# Patient Record
Sex: Female | Born: 1975 | Hispanic: Yes | Marital: Married | Smoking: Never smoker
Health system: Southern US, Community
[De-identification: ages and names within clinical notes are randomized; demographics above are authoritative.]

## PROBLEM LIST (undated history)

## (undated) ENCOUNTER — Inpatient Hospital Stay (HOSPITAL_COMMUNITY): Payer: Self-pay

## (undated) DIAGNOSIS — Z789 Other specified health status: Secondary | ICD-10-CM

## (undated) DIAGNOSIS — F419 Anxiety disorder, unspecified: Secondary | ICD-10-CM

## (undated) DIAGNOSIS — K649 Unspecified hemorrhoids: Secondary | ICD-10-CM

## (undated) DIAGNOSIS — F32A Depression, unspecified: Secondary | ICD-10-CM

## (undated) DIAGNOSIS — H8110 Benign paroxysmal vertigo, unspecified ear: Secondary | ICD-10-CM

## (undated) DIAGNOSIS — F329 Major depressive disorder, single episode, unspecified: Secondary | ICD-10-CM

## (undated) HISTORY — PX: CERVICAL CERCLAGE: SHX1329

## (undated) HISTORY — PX: HEMORRHOID SURGERY: SHX153

## (undated) HISTORY — PX: TUBAL LIGATION: SHX77

---

## 2001-07-04 ENCOUNTER — Other Ambulatory Visit: Admission: RE | Admit: 2001-07-04 | Discharge: 2001-07-04 | Payer: Self-pay | Admitting: Gynecology

## 2005-10-19 ENCOUNTER — Inpatient Hospital Stay (HOSPITAL_COMMUNITY): Admission: AD | Admit: 2005-10-19 | Discharge: 2005-10-19 | Payer: Self-pay | Admitting: Obstetrics and Gynecology

## 2005-10-23 ENCOUNTER — Inpatient Hospital Stay (HOSPITAL_COMMUNITY): Admission: AD | Admit: 2005-10-23 | Discharge: 2005-10-23 | Payer: Self-pay | Admitting: Obstetrics & Gynecology

## 2005-10-24 ENCOUNTER — Inpatient Hospital Stay (HOSPITAL_COMMUNITY): Admission: AD | Admit: 2005-10-24 | Discharge: 2005-10-26 | Payer: Self-pay | Admitting: Obstetrics and Gynecology

## 2005-11-23 ENCOUNTER — Encounter (INDEPENDENT_AMBULATORY_CARE_PROVIDER_SITE_OTHER): Payer: Self-pay | Admitting: Specialist

## 2005-11-23 ENCOUNTER — Ambulatory Visit (HOSPITAL_COMMUNITY): Admission: RE | Admit: 2005-11-23 | Discharge: 2005-11-23 | Payer: Self-pay | Admitting: General Surgery

## 2005-12-23 ENCOUNTER — Ambulatory Visit (HOSPITAL_COMMUNITY): Admission: RE | Admit: 2005-12-23 | Discharge: 2005-12-23 | Payer: Self-pay | Admitting: Obstetrics & Gynecology

## 2006-09-14 ENCOUNTER — Inpatient Hospital Stay (HOSPITAL_COMMUNITY): Admission: AD | Admit: 2006-09-14 | Discharge: 2006-09-14 | Payer: Self-pay | Admitting: Gynecology

## 2006-09-17 ENCOUNTER — Inpatient Hospital Stay (HOSPITAL_COMMUNITY): Admission: AD | Admit: 2006-09-17 | Discharge: 2006-09-17 | Payer: Self-pay | Admitting: Obstetrics & Gynecology

## 2006-09-24 ENCOUNTER — Inpatient Hospital Stay (HOSPITAL_COMMUNITY): Admission: AD | Admit: 2006-09-24 | Discharge: 2006-09-24 | Payer: Self-pay | Admitting: Family Medicine

## 2006-10-08 ENCOUNTER — Inpatient Hospital Stay (HOSPITAL_COMMUNITY): Admission: AD | Admit: 2006-10-08 | Discharge: 2006-10-08 | Payer: Self-pay | Admitting: Obstetrics and Gynecology

## 2006-10-18 ENCOUNTER — Inpatient Hospital Stay (HOSPITAL_COMMUNITY): Admission: AD | Admit: 2006-10-18 | Discharge: 2006-10-18 | Payer: Self-pay | Admitting: Obstetrics and Gynecology

## 2006-10-25 ENCOUNTER — Inpatient Hospital Stay (HOSPITAL_COMMUNITY): Admission: AD | Admit: 2006-10-25 | Discharge: 2006-10-25 | Payer: Self-pay | Admitting: Family Medicine

## 2006-10-29 ENCOUNTER — Inpatient Hospital Stay (HOSPITAL_COMMUNITY): Admission: AD | Admit: 2006-10-29 | Discharge: 2006-10-29 | Payer: Self-pay | Admitting: Gynecology

## 2007-04-27 IMAGING — US US FETAL BPP W/O NONSTRESS
1 series · 14 of 24 positions shown · non-contrast
Comparison: none

CLINICAL DATA: 39 week 5 day assigned gestational age.  Nonreactive NST.  Decreased fetal movement.

[Series 1: us fetal bpp w/o nonstress · 0.33mm/px · 24 acquisitions, 14 frames shown]
[im 1/24]
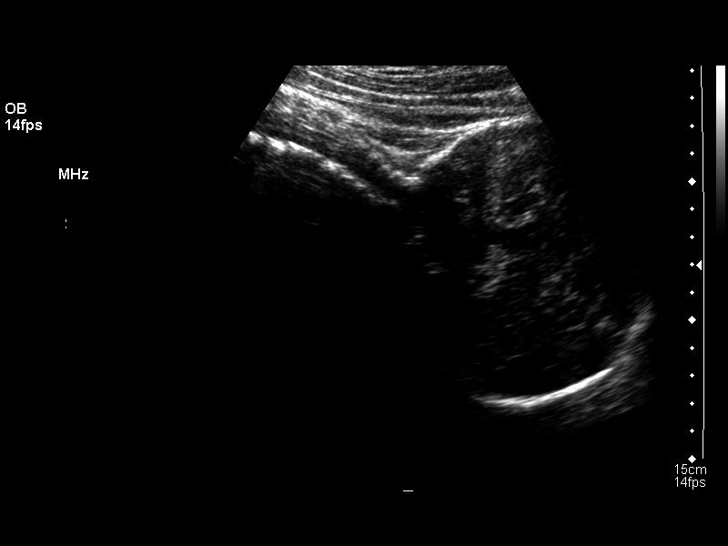
[im 3/24]
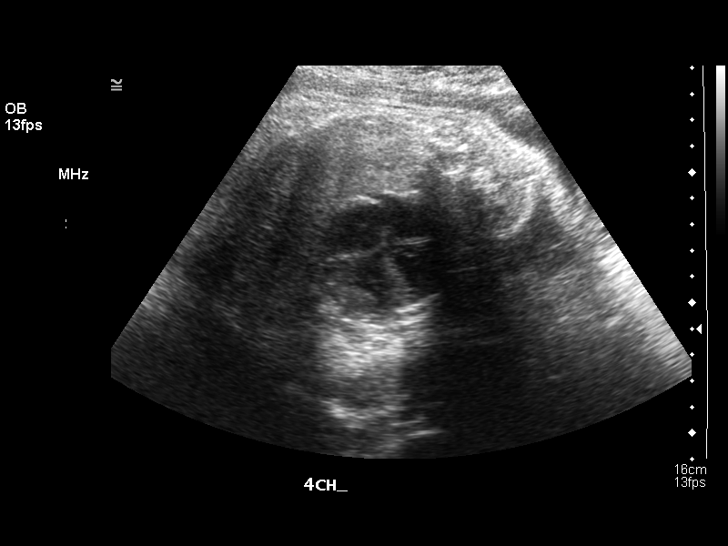
[im 5/24]
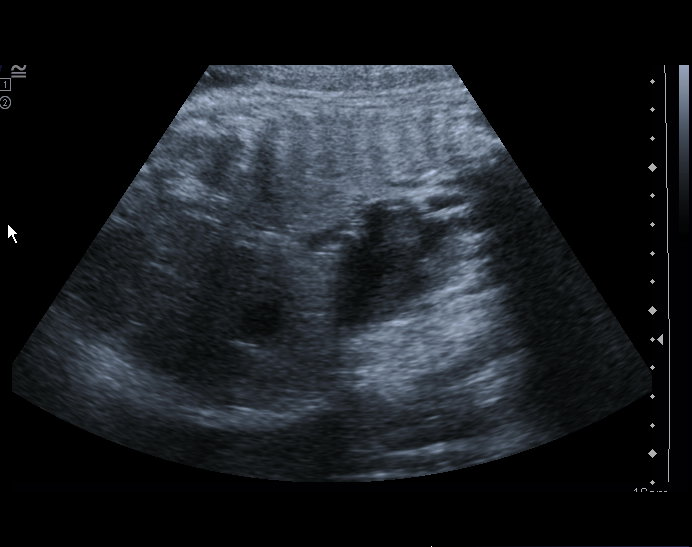
[im 7/24]
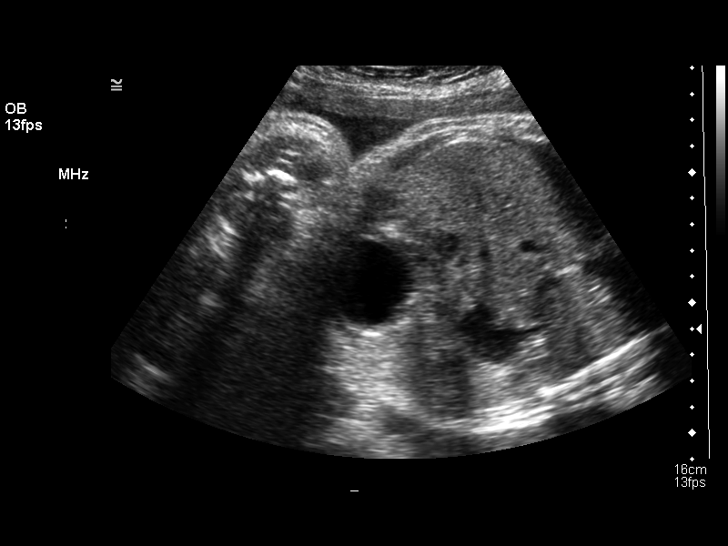
[im 8/24]
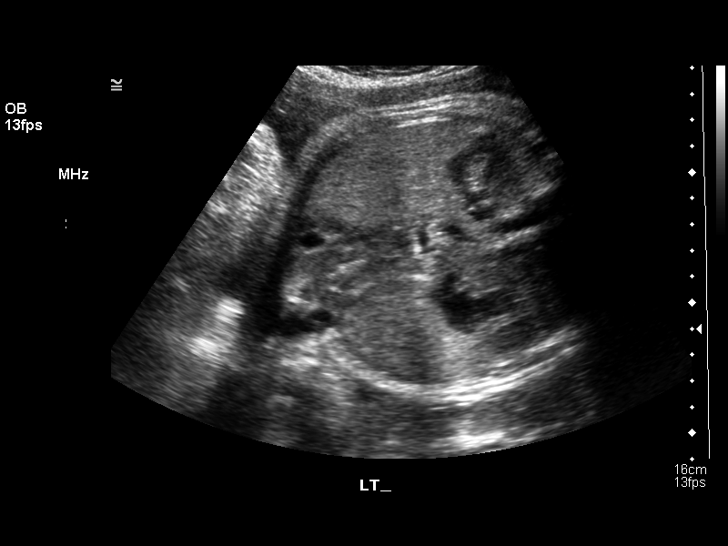
[im 10/24]
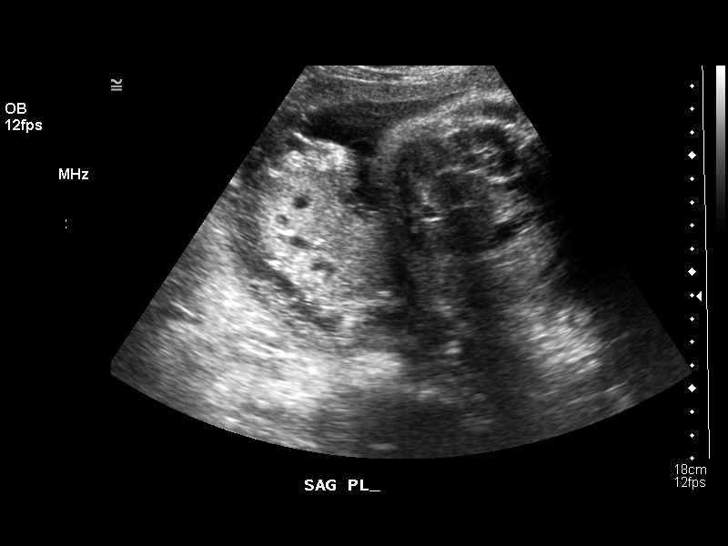
[im 12/24]
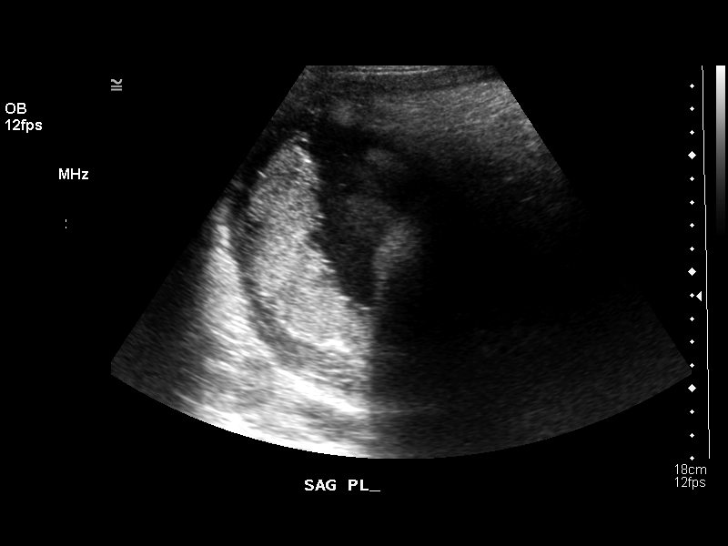
[im 13/24]
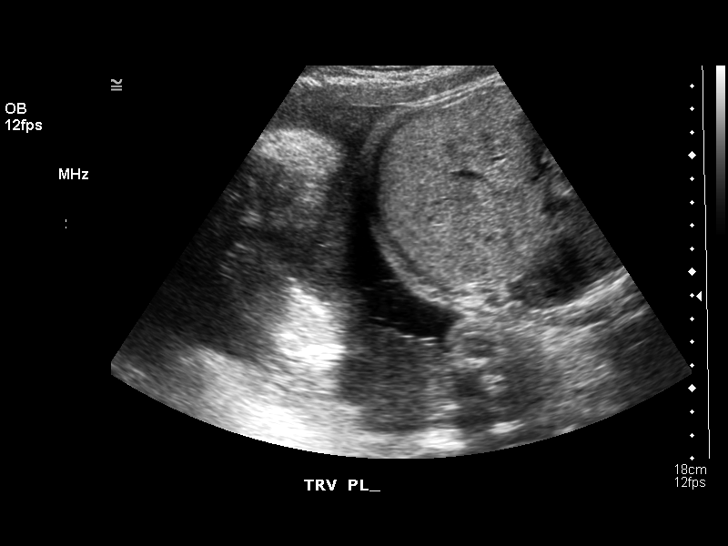
[im 15/24]
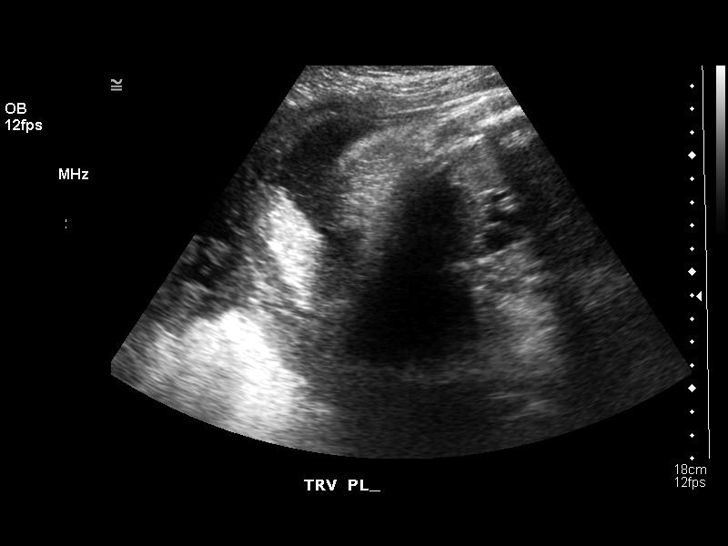
[im 17/24]
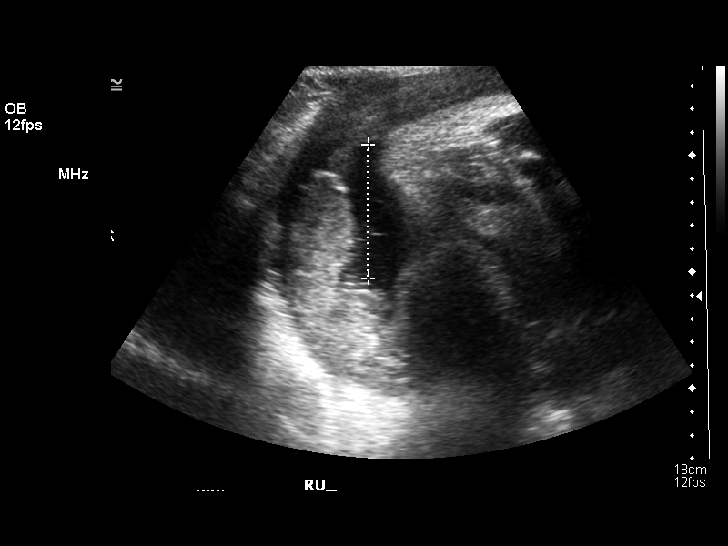
[im 19/24]
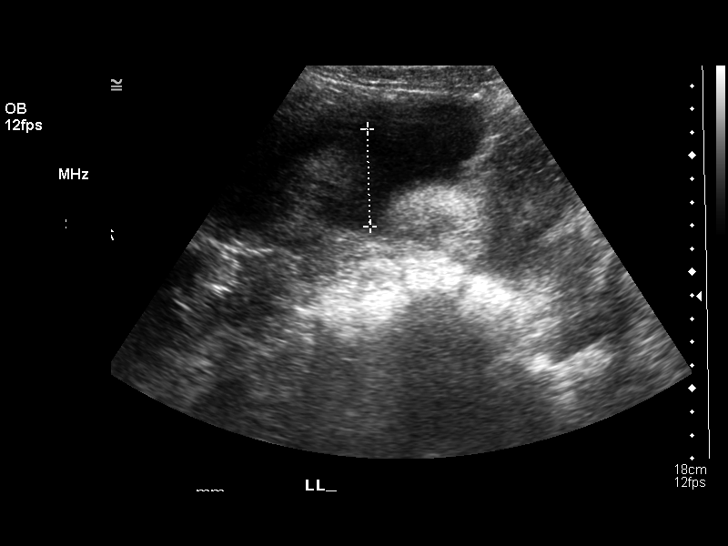
[im 20/24]
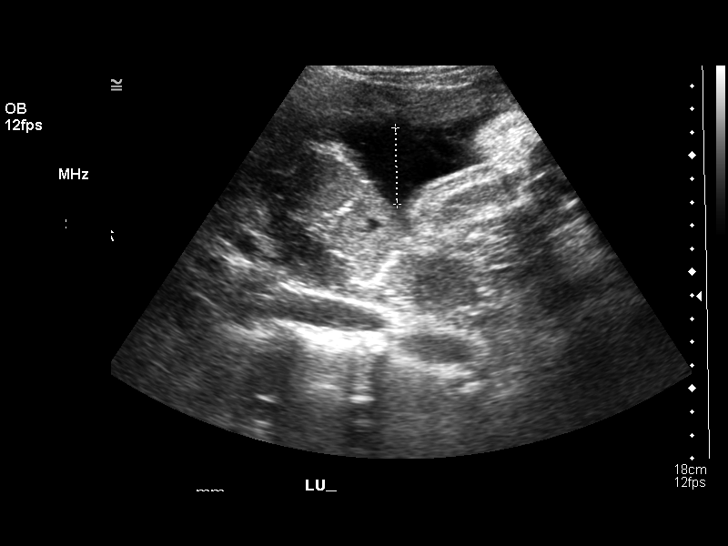
[im 22/24]
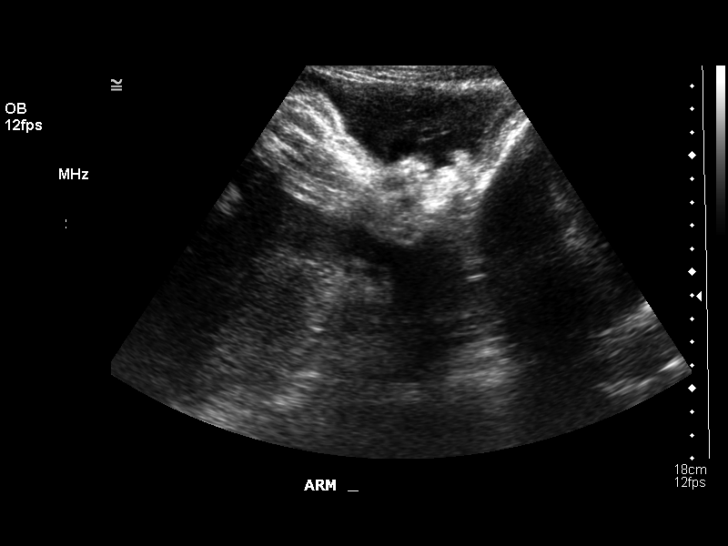
[im 24/24]
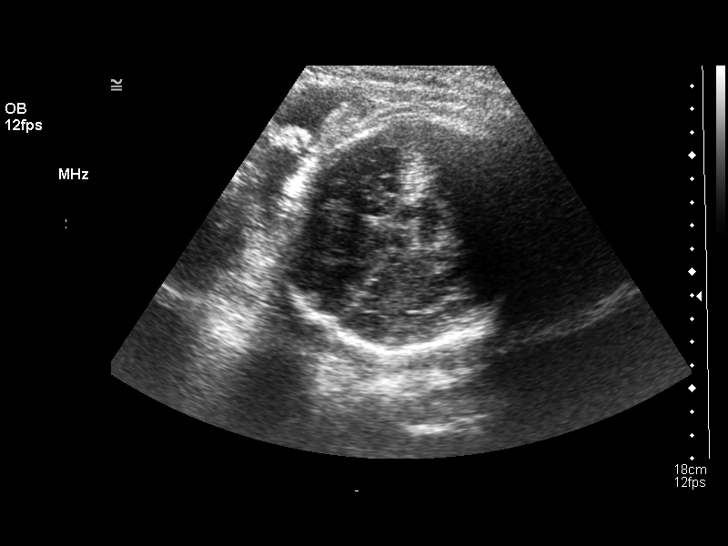

[14 of 24 positions shown; findings below may reference images not displayed]

BIOPHYSICAL PROFILE

 Number of Fetuses: 1
 Heart rate:  144
 Movement:  Yes
 Breathing:  Yes
 Presentation:  Cephalic
 Placental Location:  Posterior
 Grade:  III
 Previa:  No
 Amniotic Fluid (Subjective):  Borderline increased 
 Amniotic Fluid (Objective):  22.4 cm AFI (5th -95th%ile = 7.1 ? 21.4 cm for 40 wks)

 Fetal measurements and complete anatomic evaluation were not requested.  The following fetal anatomy was visualized on this exam:  Thalami, stomach, 3-vessel cord, kidneys, and bladder. 

 BPP SCORING
 Movements:  2  Time:  15 minutes
 Breathing:  2
 Tone: 2
 Amniotic Fluid:  2
 Total Score:  8

 MATERNAL UTERINE AND ADNEXAL FINDINGS
 Cervix: Not evaluated > 34 wks
IMPRESSION: 1.  Single living intrauterine fetus in cephalic presentation.  Borderline increased amniotic fluid volume with AFI of 22.4 cm.
 2.  Biophysical profile score [DATE].

## 2009-06-13 ENCOUNTER — Inpatient Hospital Stay (HOSPITAL_COMMUNITY): Admission: AD | Admit: 2009-06-13 | Discharge: 2009-06-13 | Payer: Self-pay | Admitting: Obstetrics and Gynecology

## 2009-09-23 ENCOUNTER — Inpatient Hospital Stay (HOSPITAL_COMMUNITY): Admission: AD | Admit: 2009-09-23 | Discharge: 2009-09-26 | Payer: Self-pay | Admitting: Obstetrics and Gynecology

## 2009-09-25 ENCOUNTER — Encounter (INDEPENDENT_AMBULATORY_CARE_PROVIDER_SITE_OTHER): Payer: Self-pay | Admitting: Obstetrics and Gynecology

## 2009-10-09 DEATH — deceased

## 2009-11-08 DEATH — deceased

## 2010-04-30 ENCOUNTER — Ambulatory Visit (HOSPITAL_COMMUNITY)
Admission: RE | Admit: 2010-04-30 | Discharge: 2010-04-30 | Payer: Self-pay | Source: Home / Self Care | Attending: Obstetrics & Gynecology | Admitting: Obstetrics & Gynecology

## 2010-05-11 NOTE — L&D Delivery Note (Signed)
Delivery Note At  a viable female was delivered via  (vaginal : ;  ).  APGAR:8 9, ; weight .  7lb 15 oz Placenta status: , to path.  Cord:  with the following complications: .short  Anesthesia:  none Episiotomy: none Lacerations: 2nd degree perineal laceration Suture Repair: 3.0 chromic Est. Blood Loss (mL): 300cc  Mom to postpartum.  Baby to nursery-stable.  Sharol Croghan A 11/20/2010, 2:04 AM

## 2010-06-06 NOTE — Op Note (Addendum)
  NAME:  [name], BUCCIERI       ACCOUNT NO.:  [id number]  MEDICAL RECORD NO.:  [id number]          PATIENT TYPE:  AMB  LOCATION:  SDC                           [facility]  PHYSICIAN:  Genia Del, M.D.DATE OF BIRTH:  06-15-1975  DATE OF PROCEDURE:  04/30/2010 DATE OF DISCHARGE:                              OPERATIVE REPORT   PREOPERATIVE DIAGNOSES:  11 plus weeks' gestation, history of incompetent cervix with premature rupture of membranes and spontaneous abortion previously at 19 plus weeks.  POSTOPERATIVE DIAGNOSES:  11 plus weeks' gestation, history of incompetent cervix with premature rupture of membranes and spontaneous abortion previously at 19 plus weeks.  PROCEDURE:  Modified McDonald cervical cerclage.  SURGEON:  Genia Del, MD  ANESTHESIOLOGIST:  Belva Agee, MD  ASSISTANT:  None.  DESCRIPTION OF PROCEDURE:  Under spinal anesthesia, the patient was in lithotomy position.  She was prepped with Betadine on the suprapubic vulvar and vaginal areas.  She was draped as usual.  The vaginal exam reveals a long and closed cervix.  No vaginal bleeding.  No fluid leak. The weighted speculum was inserted in the vagina.  The anterior lip was grasped, the cervix was grasped with a fenestrated opened clamp.  We used a Mersilene band for the cerclage.  We start at 12 o'clock and moved anticlockwise.  We stayed just under the cervical mucosa at the junction of the vaginal mucosa.  We then go in and out about 4 times. Each time we go back in very close to the point of exit, so that the Mersilene band was well covered with the cervical mucosa.  We then exit at about 0.5 cm of the first entrance.  We passed a Prolene suture under the knots to ease removal when its time.  We attached the Mersilene band with 4 flat knot.  We attached the Prolene stitch with 5 flat knots.  We verified the tension on the cerclage and it was very good, hemostasis was adequate.  We  therefore removed all instruments.  The estimated blood loss was minimal.  The patient received Ancef 1 g IV before the start of the procedure.  The count of instruments and sponges was complete.  No complications occurred and the patient was brought to recovery room in good stable status.     Genia Del, M.D.     ML/MEDQ  D:  04/30/2010  T:  04/30/2010  Job:  [id number]  Electronically Signed by Genia Del M.D. on 06/06/2010 09:15:56 AM

## 2010-07-21 LAB — CBC
MCHC: 33.6 g/dL (ref 30.0–36.0)
MCV: 88.4 fL (ref 78.0–100.0)
Platelets: 241 10*3/uL (ref 150–400)
RDW: 12.9 % (ref 11.5–15.5)

## 2010-07-28 LAB — CBC
Hemoglobin: 11.3 g/dL — ABNORMAL LOW (ref 12.0–15.0)
Hemoglobin: 12.7 g/dL (ref 12.0–15.0)
MCHC: 34.4 g/dL (ref 30.0–36.0)
MCHC: 34.4 g/dL (ref 30.0–36.0)
MCV: 92.3 fL (ref 78.0–100.0)
Platelets: 223 10*3/uL (ref 150–400)
RBC: 3.61 MIL/uL — ABNORMAL LOW (ref 3.87–5.11)
RBC: 4 MIL/uL (ref 3.87–5.11)
RDW: 13.1 % (ref 11.5–15.5)
WBC: 10.4 10*3/uL (ref 4.0–10.5)

## 2010-07-28 LAB — DIFFERENTIAL
Eosinophils Absolute: 0.1 10*3/uL (ref 0.0–0.7)
Neutro Abs: 7.9 10*3/uL — ABNORMAL HIGH (ref 1.7–7.7)
Neutrophils Relative %: 76 % (ref 43–77)

## 2010-07-28 LAB — STREP B DNA PROBE: Strep Group B Ag: NEGATIVE

## 2010-07-28 LAB — MRSA PCR SCREENING: MRSA by PCR: NEGATIVE

## 2010-07-30 LAB — URINALYSIS, ROUTINE W REFLEX MICROSCOPIC
Glucose, UA: NEGATIVE mg/dL
Ketones, ur: NEGATIVE mg/dL
Leukocytes, UA: NEGATIVE
Urobilinogen, UA: 0.2 mg/dL (ref 0.0–1.0)
pH: 6 (ref 5.0–8.0)

## 2010-07-30 LAB — CBC
HCT: 39 % (ref 36.0–46.0)
Hemoglobin: 13.2 g/dL (ref 12.0–15.0)
MCV: 92.1 fL (ref 78.0–100.0)
RDW: 12.9 % (ref 11.5–15.5)
WBC: 4.9 10*3/uL (ref 4.0–10.5)

## 2010-07-30 LAB — ABO/RH: ABO/RH(D): O POS

## 2010-07-30 LAB — WET PREP, GENITAL
Trich, Wet Prep: NONE SEEN
Yeast Wet Prep HPF POC: NONE SEEN

## 2010-07-30 LAB — URINE MICROSCOPIC-ADD ON

## 2010-07-30 LAB — GC/CHLAMYDIA PROBE AMP, GENITAL
Chlamydia, DNA Probe: NEGATIVE
GC Probe Amp, Genital: NEGATIVE

## 2010-07-30 LAB — POCT PREGNANCY, URINE: Preg Test, Ur: POSITIVE

## 2010-09-26 NOTE — Consult Note (Signed)
[name], [name] NO.:  [id number]   MEDICAL RECORD NO.:  [id number]          PATIENT TYPE:  MAT   LOCATION:  MATC                          [facility]ILITY:  [facility]   PHYSICIAN:  Lenoard Aden, M.D.DATE OF BIRTH:  [birth year]   DATE OF CONSULTATION:  DATE OF DISCHARGE:                                   CONSULTATION   This is an emergency room consultation visit.   CHIEF COMPLAINT:  Rule out preeclampsia.   She is a 35 year old Hispanic female, G2, P1, at 39+ weeks with nonreactive  NST and elevated blood pressure in the office, for re-evaluation.  She has a  history of a spontaneous vaginal delivery x1.  She is a nonsmoker and  nondrinker.  She denies domestic physical violence.  She has a personal  history of hemorrhoids and spontaneous vaginal delivery x1.   MEDICATIONS:  Prenatal vitamins.  Duet.   FAMILY HISTORY:  Myocardial infarction, breast cancer.   PHYSICAL EXAMINATION:  GENERAL:  She is a well-developed and well-nourished  Hispanic female in no acute distress.  HEENT:  Normal.  LUNGS:  Clear.  HEART:  Regular rhythm.  ABDOMEN:  Soft, gravid, nontender.  Estimated fetal weight 8-1/2 pounds.  PELVIC:  Cervix, per patient, is 4 cm dilated.  EXTREMITIES:  No cords.  DTRs 2+.  No evidence of clonus.  NEUROLOGIC:  Intact.  VITAL SIGNS:  Blood pressure 133-142/83-93.  Most recent blood pressure  133/88.   The pH labs within normal limits.  CBC, LFTs, uric acid all well within  normal limits.  BPP is 8/8 with an AFI of 22.4 in a cephalic-presenting  fetus.   IMPRESSION:  1.  A 39-week obstetrics.  2.  Reactive nonstress test with reassuring fetal heart rate tracing.  3.  Elevated blood pressures with normalization, no evidence of      preeclampsia.   PLAN:  Discharge home.  Labor warnings given.  Preeclamptic precautions  discussed.     Lenoard Aden, M.D.    RJT/MEDQ  D:  10/23/2005  T:  10/23/2005  Job:  [id number]

## 2010-09-26 NOTE — Op Note (Signed)
NAMESHAKTHI, [name] NO.:  [id number]   MEDICAL RECORD NO.:  [id number]          PATIENT TYPE:  AMB   LOCATION:  SDC                           [facility]   PHYSICIAN:  Genia Del, M.D.DATE OF BIRTH:  Mar 17, 1976   DATE OF PROCEDURE:  12/23/2005  DATE OF DISCHARGE:                                 OPERATIVE REPORT   PREOPERATIVE DIAGNOSIS:  Relaxation of introitus postpartum.   POSTOPERATIVE DIAGNOSIS:  Relaxation of introitus postpartum.   PROCEDURE:  Intervention perineoplasty.   SURGEON:  Genia Del, M.D.   ANESTHESIOLOGIST:  Raul Del, M.D.   DESCRIPTION OF PROCEDURE:  Under general anesthesia with endotracheal  intubation, the patient is in the lithotomy position.  She is prepped with  Betadine in the suprapubic, vulva and vaginal areas and draped as usual.  The introitus reveals significant relaxation allowing introduction of more  than four fingers at that level.  We put Allis clamps on either side of the  perineum and used the scalpel to open a triangular area toward the posterior  fourchette and then another triangular area toward the vagina.  The skin is  removed at that level and disposed of in the trash.  We then closed the  transverse perineal muscle with a Vicryl 3-0 given more closure and strength  to the perineum.  We used separate stitches to achieve that.  We then closed  the vaginal mucosa with a Vicryl 3-0 in a running suture.  We reapproximated  the skin with a subcuticular stitch of Vicryl 4-0 externally.  Hemostasis  was adequate at all levels.  We removed all instruments. The estimated blood  loss was minimal.  No complications occurred and the patient was transferred  to the recovery room in good status.  The introitus after the procedure was  allowing two fingers to pass loosely.      Genia Del, M.D.  Electronically Signed     ML/MEDQ  D:  12/23/2005  T:  12/23/2005  Job:  [id number]

## 2010-09-26 NOTE — H&P (Signed)
NAMECAMERIN, JIMENEZ NO.:  [id number]   MEDICAL RECORD NO.:  [id number]          PATIENT TYPE:  INP   LOCATION:  9120                          [facility]   PHYSICIAN:  Lenoard Aden, M.D.DATE OF BIRTH:  03-11-1976   DATE OF ADMISSION:  10/24/2005  DATE OF DISCHARGE:                                HISTORY & PHYSICAL   CHIEF COMPLAINT:  Labor.   HISTORY OF PRESENT ILLNESS:  She is a 35 year old Hispanic female, G2, P1 at  term, in labor.   PAST MEDICAL HISTORY:  Remarkable for vaginal delivery x1.   ALLERGIES:  NO KNOWN DRUG ALLERGIES.   MEDICATIONS:  Prenatal vitamins.   SOCIAL HISTORY:  Noncontributory. Non-smoker, non-drinker. She denies  domestic or physical violence.   FAMILY HISTORY:  Noncontributory.   PHYSICAL EXAMINATION:  GENERAL:  She is an uncomfortable appearing Hispanic  female in moderate amount of distress.  HEENT:  Normal.  LUNGS:  Clear.  HEART:  Regular rhythm.  ABDOMEN:  Soft, gravid, nontender. Estimated fetal weight 8 pounds.  GENITOURINARY:  Cervix is 10 cm, 100%, vertex zero.  EXTREMITIES:  No cords.  NEUROLOGIC:  Examination is non-focal.   IMPRESSION:  Term intrauterine pregnancy in active labor.   PLAN:  Anticipate attempt at vaginal delivery.      Lenoard Aden, M.D.     RJT/MEDQ  D:  10/25/2005  T:  10/25/2005  Job:  [id number]

## 2010-09-26 NOTE — Op Note (Signed)
[name], [name] NO.:  [id number]   MEDICAL RECORD NO.:  [id number]          PATIENT TYPE:  AMB   LOCATION:  DAY                          FACILITY:  [facility]   PHYSICIAN:  Timothy E. Earlene Plater, M.D. DATE OF BIRTH:  05-15-1975   DATE OF PROCEDURE:  11/23/2005  DATE OF DISCHARGE:                                 OPERATIVE REPORT   PREOP DIAGNOSIS:  Hemorrhoids.   POSTOP DIAGNOSIS:  Hemorrhoids.   PROCEDURE:  Hemorrhoidectomy.   SURGEON:  Dr. Kendrick Ranch.   ANESTHESIA:  General.   [name] is a healthy 29, recently delivered her second pregnancy  by vaginal birth.  She has had trouble with hemorrhoids throughout both  pregnancies and wishes to proceed with removal of those now permanent  hemorrhoids.  She was seen and carefully examined in the office and the  procedure was fully explained.  She was seen, identified and the permit  signed.   She was taken to the operating room and placed supine.  LMA anesthesia  provided and she was then carefully placed in lithotomy position.  The  perianal area was impressive with a large complex hemorrhoidal lesion  directly posterior and a smaller external lesion anterior.  There was some  swelling and bloating of some of the vessels laterally but these were  thought to be temporary  since they had not been present previously.  She  had done a rectal prep prior to surgery.  So, the anus was anesthetized  round and about with Marcaine, epinephrine and Wydase.  This was massaged in  well and some of the normal anatomy was restored revealing only the anterior-  posterior lesions.  The posterior lesion was removed first by a generous  elliptical incision beginning in the rectal mucosa.  Suture ligature of 2-0  chromic was placed there.  The edges were undermined, the superficial  varicosities removed and the remaining incision was closed with the running  2-0 chromic to the edge of the skin.  This was all inspected and  was found  the be satisfactory and the skin was closed with running 3-0 chromic.  Attention was then turned anteriorly where the excessive skin was removed.  I did  it circumferentially rather than vertically and removed the  underlying varicosities and closed the lesion circumferentially with 3-0  chromic.  She tolerated it well.  There were no other real lesions around  the anorectum.  The rectal canal was negative.  The sphincters remained  intact.  All counts correct.  Gelfoam gauze and dry sterile dressing  applied.  She was moved to recovery room in good conditions.  Full verbal  and written instructions given including Percocet and she will be followed  as an outpatient.      Timothy E. Earlene Plater, M.D.  Electronically Signed     TED/MEDQ  D:  11/23/2005  T:  11/23/2005  Job:  [id number]

## 2010-11-06 ENCOUNTER — Inpatient Hospital Stay (HOSPITAL_COMMUNITY)
Admission: AD | Admit: 2010-11-06 | Discharge: 2010-11-06 | Disposition: A | Discharge status: Home / Self Care | Payer: Commercial Managed Care - PPO | Source: Ambulatory Visit | Attending: Obstetrics and Gynecology | Admitting: Obstetrics and Gynecology

## 2010-11-06 DIAGNOSIS — O479 False labor, unspecified: Secondary | ICD-10-CM | POA: Insufficient documentation

## 2010-11-14 NOTE — H&P (Signed)
  NAME:  [name], [name] NO.:  [id number]  MEDICAL RECORD NO.:  [id number]  LOCATION:  [facility]                         [facility]  PHYSICIAN:  Lenoard Aden, M.D.DATE OF BIRTH:  1975-09-20  DATE OF ADMISSION:  11/06/2010 DATE OF DISCHARGE:  11/06/2010                             HISTORY & PHYSICAL   CHIEF COMPLAINT:  Increased discharge and occasional contractions.  HISTORY OF PRESENT ILLNESS:  She is a 35 year old Hispanic female G6, P2- 0-3-1 at 20th and 4/7 weeks' gestation who presents with aforementioned complaints.  The patient is status post cerclage removal 1 week ago.  SOCIAL HISTORY:  She is a nonsmoker and nondrinker.  She denies domestic or physical violence.  ALLERGIES:  She has no known drug allergies.  She has a history of incompetent cervix, status post cerclage was noted. She is GBS bacteriuria.  She has a history of weekly 17- hydroxyprogesterone therapy this pregnancy.  Her medications at this time are prenatal vitamins.  Family history of breast cancer, heart disease.  She has a history of two vaginal deliveries, one TAB,  SAB and a missed AB.  She has a history of vaginal repair and revision and tooth extraction.  PHYSICAL EXAMINATION:  GENERAL:  She is well-developed, well-nourished Hispanic female in no acute distress. HEENT:  Normal. NECK:  Supple.  Full range of motion. LUNGS:  Clear. HEART:  Regular rhythm. ABDOMEN:  Soft, gravid, and nontender.  Estimated fetal weight 7 pounds. Cervix is 4+ cm, 60% vertex -1 and posterior, soft. EXTREMITIES:  No cords. NEUROLOGICAL:  Nonfocal. SKIN:  Intact.  No evidence of SROM.  NST is reactive.  Rare contractions were noted.  IMPRESSION:  Term intrauterine pregnancy with advanced dilatation and history of cervical incompetence, Group B streptococcus bacteria.  PLAN:  Discharge home.  RECOMMEND:  Delivery at 39 weeks.  We will need penicillin prophylaxis, cautious, strict labor  warnings were given.     Lenoard Aden, M.D.     RJT/MEDQ  D:  11/06/2010  T:  11/07/2010  Job:  [id number]  Electronically Signed by Olivia Mackie M.D. on 11/14/2010 07:36:48 AM

## 2010-11-19 ENCOUNTER — Inpatient Hospital Stay (HOSPITAL_COMMUNITY)
Admission: AD | Admit: 2010-11-19 | Discharge: 2010-11-21 | DRG: 775 | Disposition: A | Discharge status: Home / Self Care | Payer: Commercial Managed Care - PPO | Source: Ambulatory Visit | Attending: Obstetrics and Gynecology | Admitting: Obstetrics and Gynecology

## 2010-11-19 DIAGNOSIS — O480 Post-term pregnancy: Secondary | ICD-10-CM | POA: Diagnosis present

## 2010-11-19 DIAGNOSIS — IMO0001 Reserved for inherently not codable concepts without codable children: Secondary | ICD-10-CM

## 2010-11-19 DIAGNOSIS — O3430 Maternal care for cervical incompetence, unspecified trimester: Secondary | ICD-10-CM | POA: Diagnosis present

## 2010-11-19 HISTORY — DX: Other specified health status: Z78.9

## 2010-11-20 ENCOUNTER — Other Ambulatory Visit: Payer: Self-pay | Admitting: Obstetrics and Gynecology

## 2010-11-20 ENCOUNTER — Encounter (HOSPITAL_COMMUNITY): Payer: Self-pay | Admitting: *Deleted

## 2010-11-20 DIAGNOSIS — O480 Post-term pregnancy: Secondary | ICD-10-CM | POA: Diagnosis present

## 2010-11-20 DIAGNOSIS — IMO0001 Reserved for inherently not codable concepts without codable children: Secondary | ICD-10-CM

## 2010-11-20 LAB — TYPE AND SCREEN: Antibody Screen: NEGATIVE

## 2010-11-20 LAB — CBC
Hemoglobin: 12.9 g/dL (ref 12.0–15.0)
MCH: 29.9 pg (ref 26.0–34.0)
MCH: 30.3 pg (ref 26.0–34.0)
Platelets: 215 10*3/uL (ref 150–400)
Platelets: 222 10*3/uL (ref 150–400)
RBC: 4.01 MIL/uL (ref 3.87–5.11)
RBC: 4.26 MIL/uL (ref 3.87–5.11)
RDW: 14.4 % (ref 11.5–15.5)
WBC: 12.4 10*3/uL — ABNORMAL HIGH (ref 4.0–10.5)
WBC: 7.1 10*3/uL (ref 4.0–10.5)

## 2010-11-20 LAB — ABO/RH: RH Type: POSITIVE

## 2010-11-20 LAB — COMPREHENSIVE METABOLIC PANEL
ALT: 19 U/L (ref 0–35)
AST: 28 U/L (ref 0–37)
Albumin: 2.5 g/dL — ABNORMAL LOW (ref 3.5–5.2)
CO2: 23 mEq/L (ref 19–32)
Calcium: 8.9 mg/dL (ref 8.4–10.5)
Sodium: 136 mEq/L (ref 135–145)

## 2010-11-20 LAB — GLUCOSE, CAPILLARY: Glucose-Capillary: 87 mg/dL (ref 70–99)

## 2010-11-20 LAB — RPR
RPR Ser Ql: NONREACTIVE
RPR: NONREACTIVE

## 2010-11-20 LAB — HIV ANTIBODY (ROUTINE TESTING W REFLEX): HIV: NONREACTIVE

## 2010-11-20 MED ORDER — OXYCODONE-ACETAMINOPHEN 5-325 MG PO TABS: 2.0000 | ORAL_TABLET | ORAL | Status: DC | PRN

## 2010-11-20 MED ORDER — LIDOCAINE HCL (PF) 1 % IJ SOLN: 30.0000 mL | Freq: Once | INTRAMUSCULAR | Status: DC | PRN

## 2010-11-20 MED ORDER — LANOLIN HYDROUS EX OINT: TOPICAL_OINTMENT | CUTANEOUS | Status: DC | PRN

## 2010-11-20 MED ORDER — FLEET ENEMA 7-19 GM/118ML RE ENEM: 1.0000 | ENEMA | RECTAL | Status: DC | PRN

## 2010-11-20 MED ORDER — IBUPROFEN 600 MG PO TABS
600.0000 mg | ORAL_TABLET | Freq: Four times a day (QID) | ORAL | Status: DC
  Administered 2010-11-20 – 2010-11-21 (×5): 600 mg via ORAL
  Filled 2010-11-20 (×5): qty 1

## 2010-11-20 MED ORDER — LACTATED RINGERS IV SOLN: 500.0000 mL | INTRAVENOUS | Status: DC | PRN

## 2010-11-20 MED ORDER — SIMETHICONE 80 MG PO CHEW: 80.0000 mg | CHEWABLE_TABLET | ORAL | Status: DC | PRN

## 2010-11-20 MED ORDER — BENZOCAINE-MENTHOL 20-0.5 % EX AERO
INHALATION_SPRAY | CUTANEOUS | Status: AC
  Filled 2010-11-20: qty 56

## 2010-11-20 MED ORDER — LIDOCAINE HCL (PF) 1 % IJ SOLN
30.0000 mL | Freq: Once | INTRAMUSCULAR | Status: AC | PRN
  Administered 2010-11-20: 30 mL via SUBCUTANEOUS
  Filled 2010-11-20: qty 30

## 2010-11-20 MED ORDER — CITRIC ACID-SODIUM CITRATE 334-500 MG/5ML PO SOLN: 30.0000 mL | ORAL | Status: DC | PRN

## 2010-11-20 MED ORDER — ACETAMINOPHEN 325 MG PO TABS: 650.0000 mg | ORAL_TABLET | ORAL | Status: DC | PRN

## 2010-11-20 MED ORDER — SODIUM CHLORIDE 0.9 % IV SOLN: 250.0000 mL | INTRAVENOUS | Status: DC

## 2010-11-20 MED ORDER — WITCH HAZEL-GLYCERIN EX PADS: MEDICATED_PAD | CUTANEOUS | Status: DC | PRN

## 2010-11-20 MED ORDER — ONDANSETRON HCL 4 MG/2ML IJ SOLN: 4.0000 mg | Freq: Four times a day (QID) | INTRAMUSCULAR | Status: DC | PRN

## 2010-11-20 MED ORDER — OXYTOCIN 20 UNITS IN LACTATED RINGERS INFUSION - SIMPLE: 125.0000 mL/h | Freq: Once | INTRAVENOUS | Status: DC

## 2010-11-20 MED ORDER — LACTATED RINGERS IV SOLN
INTRAVENOUS | Status: DC
Start: 2010-11-20 — End: 2010-11-20
  Administered 2010-11-20: via INTRAVENOUS

## 2010-11-20 MED ORDER — ONDANSETRON HCL 4 MG/2ML IJ SOLN: 4.0000 mg | INTRAMUSCULAR | Status: DC | PRN

## 2010-11-20 MED ORDER — DIPHENHYDRAMINE HCL 25 MG PO CAPS: 25.0000 mg | ORAL_CAPSULE | Freq: Four times a day (QID) | ORAL | Status: DC | PRN

## 2010-11-20 MED ORDER — OXYCODONE-ACETAMINOPHEN 5-325 MG PO TABS: 1.0000 | ORAL_TABLET | ORAL | Status: DC | PRN

## 2010-11-20 MED ORDER — ZOLPIDEM TARTRATE 5 MG PO TABS: 5.0000 mg | ORAL_TABLET | Freq: Every evening | ORAL | Status: DC | PRN

## 2010-11-20 MED ORDER — OXYTOCIN 20 UNITS IN LACTATED RINGERS INFUSION - SIMPLE
125.0000 mL/h | Freq: Once | INTRAVENOUS | Status: AC
  Administered 2010-11-20: 125 mL/h via INTRAVENOUS
  Filled 2010-11-20: qty 1000

## 2010-11-20 MED ORDER — METHYLERGONOVINE MALEATE 0.2 MG PO TABS: 0.2000 mg | ORAL_TABLET | ORAL | Status: DC | PRN

## 2010-11-20 MED ORDER — OXYTOCIN 20 UNITS IN LACTATED RINGERS INFUSION - SIMPLE: 125.0000 mL/h | INTRAVENOUS | Status: DC | PRN

## 2010-11-20 MED ORDER — METHYLERGONOVINE MALEATE 0.2 MG/ML IJ SOLN: 0.2000 mg | Freq: Four times a day (QID) | INTRAMUSCULAR | Status: DC | PRN

## 2010-11-20 MED ORDER — RHO D IMMUNE GLOBULIN 1500 UNIT/2ML IJ SOLN: 300.0000 ug | Freq: Once | INTRAMUSCULAR | Status: DC

## 2010-11-20 MED ORDER — IBUPROFEN 600 MG PO TABS
600.0000 mg | ORAL_TABLET | Freq: Four times a day (QID) | ORAL | Status: DC | PRN
  Filled 2010-11-20: qty 1

## 2010-11-20 MED ORDER — MEASLES, MUMPS & RUBELLA VAC ~~LOC~~ INJ
0.5000 mL | INJECTION | Freq: Once | SUBCUTANEOUS | Status: DC
  Filled 2010-11-20: qty 0.5

## 2010-11-20 MED ORDER — IBUPROFEN 600 MG PO TABS: 600.0000 mg | ORAL_TABLET | Freq: Four times a day (QID) | ORAL | Status: DC | PRN

## 2010-11-20 MED ORDER — ONDANSETRON HCL 4 MG PO TABS: 4.0000 mg | ORAL_TABLET | ORAL | Status: DC | PRN

## 2010-11-20 MED ORDER — FERROUS SULFATE 325 (65 FE) MG PO TABS
325.0000 mg | ORAL_TABLET | Freq: Two times a day (BID) | ORAL | Status: DC
  Administered 2010-11-20 – 2010-11-21 (×3): 325 mg via ORAL
  Filled 2010-11-20 (×3): qty 1

## 2010-11-20 MED ORDER — SENNOSIDES-DOCUSATE SODIUM 8.6-50 MG PO TABS
1.0000 | ORAL_TABLET | Freq: Every day | ORAL | Status: DC
  Administered 2010-11-20: 1 via ORAL

## 2010-11-20 MED ORDER — BENZOCAINE-MENTHOL 20-0.5 % EX AERO: 1.0000 "application " | INHALATION_SPRAY | CUTANEOUS | Status: DC | PRN

## 2010-11-20 MED ORDER — SODIUM CHLORIDE 0.9 % IJ SOLN: 3.0000 mL | INTRAMUSCULAR | Status: DC | PRN

## 2010-11-20 MED ORDER — LACTATED RINGERS IV SOLN: INTRAVENOUS | Status: DC

## 2010-11-20 MED ORDER — PRENATAL PLUS 27-1 MG PO TABS
1.0000 | ORAL_TABLET | Freq: Every day | ORAL | Status: DC
  Administered 2010-11-20 – 2010-11-21 (×2): 1 via ORAL
  Filled 2010-11-20 (×2): qty 1

## 2010-11-20 MED ORDER — SODIUM CHLORIDE 0.9 % IJ SOLN: 3.0000 mL | Freq: Two times a day (BID) | INTRAMUSCULAR | Status: DC

## 2010-11-20 NOTE — H&P (Signed)
[name] is a 35 y.o. female presenting @ 40 4/7 weeks in active labor. Denies SROM (+) FM  PNC complicated by cervical incompetence. Pt had cerclage History OB History    Grav Para Term Preterm Abortions TAB SAB Ect Mult Living   5 3 2  0 2 1 1  0 0 2     Past Medical History  Diagnosis Date  . No pertinent past medical history    Past Surgical History  Procedure Date  . Hemorrhoid surgery     after delivery  . Cervical cerclage    Family History: family history is not on file. Social History:  does not have a smoking history on file. She does not have any smokeless tobacco history on file. Her alcohol and drug histories not on file.  Review of Systems  Genitourinary: Negative.   All other systems reviewed and are negative.    Dilation: 9 Effacement (%): 100 Station: -3 Exam by:: Moussa Wiegand Blood pressure 138/87, pulse 75, temperature 97.6 F (36.4 C), temperature source Axillary, resp. rate 22, unknown if currently breastfeeding. Exam Physical Exam  Constitutional: She is oriented to person, place, and time. She appears well-developed and well-nourished. She appears distressed.  HENT:  Head: Normocephalic.  Eyes: Pupils are equal, round, and reactive to light.  Cardiovascular: Normal rate.   Respiratory: Breath sounds normal.  GI:       Gravid. Mild palp ctx  Musculoskeletal: She exhibits no edema.  Neurological: She is alert and oriented to person, place, and time.  Skin: Skin is warm and dry.    Prenatal labs: ABO, Rh:   Antibody: Negative (07/12 0041) Rubella:   RPR: Nonreactive (07/12 0040)  HBsAg: Negative (07/12 0041)  HIV: Non-reactive (07/12 0041)  GBS: Negative (06/21 0000)   Assessment/Plan: Active Labor 2) Postdates P) Admit. Routine labs. Amniotomy. Anticipate SVD   Genee Rann A 11/20/2010, 1:56 AM

## 2010-11-20 NOTE — Progress Notes (Signed)
  S: breathing with ctx  O: VE: 9cm /100/-3 bulging membrane. AROM @ 12: 38 am. Med meconium fluid  Tracing baseline 140 (+) accels ? Ctx freq  A: Active Labor Postterm  P) anticipate SVD

## 2010-11-20 NOTE — Progress Notes (Signed)
Experienced BF mom. Nursed 1st baby for 2 years. 2nd baby- thought she didn't have enough milk so didn't nurse very long. Concerned that this baby is nursing more often than every 3 hours- reassurance given. Discussed cluster feeding. LS 10 by RN. No questions at present.

## 2010-11-20 NOTE — ED Notes (Signed)
Pt is G6P5 at 40.4wks. Came into MAU very uncomfortable. Taken straight back to Rm #7. SVE by Briant Cedar RN 8/90/0. FHR dopplered at 156. M. Topp RN called report to [employer] in [employer]. Nancee Liter RN called Dr Seymour Bars and no return call from MD. Dr Cherly Hensen called and made aware of pt's status and to admit pt.

## 2010-11-20 NOTE — Progress Notes (Signed)
  PPD 0 SVD  Interval note  S:  Pt reports feeling well/ Tolerating po/ Voiding without problems/ No n/v/ Bleeding is light/ Pain controlled withprescription NSAID's including MOtrin  Newborn info female, BFing well   O:  A & O x 3 NAD/ VS: Blood pressure 112/71, pulse 78, temperature 98.1 F (36.7 C), temperature source Oral, resp. rate 18, SpO2 98.00%,  Labs: CBC pending in AM   I&O: I/O last 3 completed shifts: In: -  Out: 400 [Blood:400]      Abdomen: soft, NT   Perineum: icepack in place, mild edema   Lochia: light  Extremities: no edema, neg Homan's    A/P: PPD # 0/ s/p SVD  Doing well  Continue routine post partum orders  Anticipate d/c in AM

## 2010-11-21 LAB — CBC
HCT: 32.6 % — ABNORMAL LOW (ref 36.0–46.0)
Hemoglobin: 10.8 g/dL — ABNORMAL LOW (ref 12.0–15.0)
MCH: 29.8 pg (ref 26.0–34.0)
MCV: 90.1 fL (ref 78.0–100.0)
Platelets: 182 10*3/uL (ref 150–400)
RBC: 3.62 MIL/uL — ABNORMAL LOW (ref 3.87–5.11)
WBC: 6.9 10*3/uL (ref 4.0–10.5)

## 2010-11-21 MED ORDER — TETANUS-DIPHTH-ACELL PERTUSSIS 5-2-15.5 LF-MCG/0.5 IM SUSP: 0.5000 mL | Freq: Once | INTRAMUSCULAR | Status: DC

## 2010-11-21 MED ORDER — TETANUS-DIPHTH-ACELL PERTUSSIS 5-2-15.5 LF-MCG/0.5 IM SUSP
0.5000 mL | Freq: Once | INTRAMUSCULAR | Status: DC
  Filled 2010-11-21: qty 0.5

## 2010-11-21 MED ORDER — TETANUS-DIPHTH-ACELL PERTUSSIS 5-2.5-18.5 LF-MCG/0.5 IM SUSP
0.5000 mL | Freq: Once | INTRAMUSCULAR | Status: AC
Start: 2010-11-21 — End: 2010-11-21
  Administered 2010-11-21: 0.5 mL via INTRAMUSCULAR
  Filled 2010-11-21: qty 0.5

## 2010-11-21 MED ORDER — WITCH HAZEL-GLYCERIN EX PADS: MEDICATED_PAD | CUTANEOUS | Status: AC | PRN

## 2010-11-21 MED ORDER — IBUPROFEN 600 MG PO TABS: 600.0000 mg | ORAL_TABLET | Freq: Four times a day (QID) | ORAL | Status: AC

## 2010-11-21 NOTE — Progress Notes (Signed)
  Post Partum Day #1 S/P:spontaneous vaginal  RH status/Rubella reviewed.  Feeding: breast TdaP not up to date per chart review Subjective: No HA, SOB, CP, F/C, breast symptoms. Normal vaginal bleeding, no clots.     Objective: BP 123/77  Pulse 80  Temp(Src) 98 F (36.7 C) (Oral)  Resp 19  SpO2 98%  I&O reviewed.   Physical Exam:  General: alert, cooperative and no distress Lochia: appropriate Uterine Fundus: firm DVT Evaluation: No evidence of DVT seen on physical exam. Negative Homan's sign. No significant calf/ankle edema.   Basename 11/21/10 0835 11/20/10 0510  HGB 10.8* 12.0  HCT 32.6* 35.6*    Assessment/Plan: 35 y.o.  PPD # 2 .  normal postpartum exam Continue current postpartum care TdaP vaccine prior to d/c D/C home   LOS: 2 days   [name] 11/21/2010, 9:35 AM

## 2010-11-21 NOTE — Progress Notes (Signed)
Pt slightly sore assisted with better latch. Proper support with pillows and hand. Mother has older children 38 nd 61, scheduled outpatient visit on July 25 at 9 a,m. Observed good swallowing but mom concerned she doesn't have enough milk.

## 2010-11-21 NOTE — Discharge Summary (Signed)
  Obstetric Discharge Summary Reason for Admission: onset of labor Prenatal Procedures: none Intrapartum Procedures: spontaneous vaginal delivery Postpartum Procedures: none Complications-Operative and Postpartum: 2nd degree perineal laceration  Hemoglobin  Date Value Range Status  11/21/2010 10.8* 12.0-15.0 (g/dL) Final     HCT  Date Value Range Status  11/21/2010 32.6* 36.0-46.0 (%) Final    Discharge Diagnoses: Term Pregnancy-delivered  Discharge Information: Date: 11/21/2010 Activity: pelvic rest Diet: routine Medications: Ibuprophen Condition: stable Instructions: refer to routine discharge instructions Discharge to: home   Newborn Data: Live born  Information for the patient's newborn:  [name], Girl Paije [[id number]]  female APGAR 8/9, wgt 7-15 Home with mother.    Gunther Zawadzki 11/21/2010, 9:38 AM

## 2010-12-03 ENCOUNTER — Ambulatory Visit (HOSPITAL_COMMUNITY)
Admit: 2010-12-03 | Discharge: 2010-12-03 | Disposition: A | Discharge status: Home / Self Care | Payer: Commercial Managed Care - PPO | Attending: Obstetrics and Gynecology | Admitting: Obstetrics and Gynecology

## 2010-12-03 NOTE — Progress Notes (Addendum)
Mother is an  experienced breastfeeding mom, last child 34 yrs old.Difficult latch leaving hospital with weight loss, appt was scheduled for fup visit with lactation. Mother states breastfeeding going much better. Birth weight was 7-5 and today weight was 8-5, with gram weight of 3790. 8-12 wets and 4 -5 dirty diapers daily.Mother has been giving formula in bottle 2 x daily , 1-2 ounces each bottle. Observed great latch with with good support and position. Mom doing good breast compression, observed rhythmic sucking and swallows. Mom states infant awakes freq during the night  cluster feeding. Clydie Braun sleepy this morning and aroused freq to stay on 1st breast, breast fed 20 minutes and took 38 ml, weight 3828 gms. After feeding. Assisted with latch on 2nd breast,  Clydie Braun breastfed for 10 mins. Clydie Braun took 16 ml form 2nd breast. Post pumped mom and got 25 ml from rt breast and 15ml from lt breast . Encouraged to pump after feeding with hand pump 2 times daily to have own milk instead of formula . Inst to breast feed in the night instead of giving bottles. Scheduled appt for fup on August 2 at 1:00 pm.

## 2010-12-06 NOTE — Progress Notes (Signed)
Mother is an  experienced breastfeeding mom, last child 34 yrs old.Difficult latch leaving hospital with weight loss, appt was scheduled for fup visit with lactation. Mother states breastfeeding going much better. Birth weight was 7-5 and today weight was 8-5, with gram weight of 3790. 8-12 wets and 4 -5 dirty diapers daily.Mother has been giving formula in bottle 2 x daily , 1-2 ounces each bottle. Observed great latch with with good support and position. Mom doing good breast compression, observed rhythmic sucking and swallows. Mom states infant awakes freq during the night  cluster feeding. [name] sleepy this morning and aroused freq to stay on 1st breast, breast fed 20 minutes and took 38 ml, weight 3828 gms. After feeding. Assisted with latch on 2nd breast,  [name] breastfed for 10 mins. .[name] took 16 ml form 2nd breast. Post pumped mom and got 25 ml from rt breast and 15ml from lt breast . Encouraged to pump after feeding with hand pump 2 times daily to have own milk instead of formula . Inst to breast feed in the night instead of giving bottles. Scheduled appt for fup on August 2 at 1:00 pm. 

## 2010-12-11 ENCOUNTER — Inpatient Hospital Stay (HOSPITAL_COMMUNITY)
Admission: RE | Admit: 2010-12-11 | Discharge: 2010-12-11 | Payer: Commercial Managed Care - PPO | Source: Ambulatory Visit

## 2010-12-11 NOTE — Progress Notes (Signed)
BABY:  [name]  DOB [birth year]  BIRTH WEIGHT 7-15   WEIGHT TODAY 9-1 MOTHER:  Yeng FLORIAN Wu  MOTHER STATES BABY IS NOT EATING LONG ENOUGH AT FEEDINGS.  FEEDINGS LAST 5-15 MIN.  BABY NURSES EVERY 1.5 HRS-3 HRS.  8 WETS/24HRS  6 STOOLS/24 HRS  BABY OBSERVED AT BREAST WITH GOOD SUCK/SWALLOWS.  BABY HAS GAINED 12 OZ/7 DAYS.  REASSURED MOTHER THAT BABY IS GROWING WELL AND GETTING ENOUGH MILK.  ENCOURAGED TO CALL us WITH CONCERNS.

## 2011-02-25 LAB — WET PREP, GENITAL: Clue Cells Wet Prep HPF POC: NONE SEEN

## 2011-02-25 LAB — HCG, QUANTITATIVE, PREGNANCY: hCG, Beta Chain, Quant, S: 2

## 2013-05-27 ENCOUNTER — Inpatient Hospital Stay (HOSPITAL_COMMUNITY): Payer: Commercial Managed Care - PPO

## 2013-05-27 ENCOUNTER — Inpatient Hospital Stay (HOSPITAL_COMMUNITY)
Admission: AD | Admit: 2013-05-27 | Discharge: 2013-05-27 | Disposition: A | Discharge status: Home / Self Care | Payer: Commercial Managed Care - PPO | Source: Ambulatory Visit | Attending: Obstetrics and Gynecology | Admitting: Obstetrics and Gynecology

## 2013-05-27 ENCOUNTER — Encounter (HOSPITAL_COMMUNITY): Payer: Self-pay | Admitting: *Deleted

## 2013-05-27 DIAGNOSIS — O200 Threatened abortion: Secondary | ICD-10-CM | POA: Insufficient documentation

## 2013-05-27 LAB — CBC
HCT: 36 % (ref 36.0–46.0)
HEMOGLOBIN: 12.3 g/dL (ref 12.0–15.0)
MCH: 29.5 pg (ref 26.0–34.0)
MCHC: 34.2 g/dL (ref 30.0–36.0)
MCV: 86.3 fL (ref 78.0–100.0)
Platelets: 259 10*3/uL (ref 150–400)
RBC: 4.17 MIL/uL (ref 3.87–5.11)
RDW: 13.5 % (ref 11.5–15.5)
WBC: 7.2 10*3/uL (ref 4.0–10.5)

## 2013-05-27 LAB — HCG, QUANTITATIVE, PREGNANCY: hCG, Beta Chain, Quant, S: 5609 m[IU]/mL — ABNORMAL HIGH (ref <5)

## 2013-05-27 NOTE — H&P (Signed)
NAMCharma Wu:  FLORIAN RAMIREZ, Captola       ACCOUNT NO.:  [id number]  MEDICAL RECORD NO.:  [id number]  LOCATION:  [id number]                          [facility]ILITY:  [facility]  PHYSICIAN:  Lenoard Adenichard J. Donte Kary, M.D.DATE OF BIRTH:  11-24-1975  DATE OF ADMISSION:  05/27/2013 DATE OF DISCHARGE:                             HISTORY & PHYSICAL   CHIEF COMPLAINT:  Bleeding.  HISTORY OF PRESENT ILLNESS:  She is a 38 year old Hispanic female, G7, P2, who presents with known missed AB with a 6 week sac at [redacted] weeks gestation as seen in the office 1 week ago, with increased bleeding today.  She denies tissue passage.  Her medications are none.  She has no known drug allergies.  She has family history of myocardial infarction, breast cancer.  She has a pregnancy history of 1 abortion, 2 miscarriages, and 2 full- term pregnancies.  She has a personal history of cervical incompetence status post cervical os with previous pregnancy.  PHYSICAL EXAM:  GENERAL:  This is a well-developed, well-nourished Hispanic female, in no acute distress. HEENT:  Normal. NECK:  Supple.  Full range of motion. LUNGS:  Clear. HEART:  Regular rhythm. ABDOMEN:  Soft, nontender. PELVIC:  Reveals a mildly enlarged uterus.  Minimal bleeding and no adnexal masses. EXTREMITIES:  There are no cords. NEUROLOGIC:  Nonfocal. SKIN:  Intact.  Quantitative HCG pending.  CBC is normal.  Ultrasound pending.  She is Rh positive.  IMPRESSION:  Likely spontaneous abortion.  PLAN:  Probable discharge home.  Check ultrasound results.  Follow serial quants.  Bleeding precautions to be given.     Lenoard Adenichard J. Wheeler Incorvaia, M.D.     RJT/MEDQ  D:  05/27/2013  T:  05/27/2013  Job:  [id number]

## 2013-05-27 NOTE — Discharge Instructions (Signed)
Miscarriage A miscarriage is the sudden loss of an unborn baby (fetus) before the 20th week of pregnancy. Most miscarriages happen in the first 3 months of pregnancy. Sometimes, it happens before a woman even knows she is pregnant. A miscarriage is also called a "spontaneous miscarriage" or "early pregnancy loss." Having a miscarriage can be an emotional experience. Talk with your caregiver about any questions you may have about miscarrying, the grieving process, and your future pregnancy plans. CAUSES   Problems with the fetal chromosomes that make it impossible for the baby to develop normally. Problems with the baby's genes or chromosomes are most often the result of errors that occur, by chance, as the embryo divides and grows. The problems are not inherited from the parents.  Infection of the cervix or uterus.   Hormone problems.   Problems with the cervix, such as having an incompetent cervix. This is when the tissue in the cervix is not strong enough to hold the pregnancy.   Problems with the uterus, such as an abnormally shaped uterus, uterine fibroids, or congenital abnormalities.   Certain medical conditions.   Smoking, drinking alcohol, or taking illegal drugs.   Trauma.  Often, the cause of a miscarriage is unknown.  SYMPTOMS   Vaginal bleeding or spotting, with or without cramps or pain.  Pain or cramping in the abdomen or lower back.  Passing fluid, tissue, or blood clots from the vagina. DIAGNOSIS  Your caregiver will perform a physical exam. You may also have an ultrasound to confirm the miscarriage. Blood or urine tests may also be ordered. TREATMENT   Sometimes, treatment is not necessary if you naturally pass all the fetal tissue that was in the uterus. If some of the fetus or placenta remains in the body (incomplete miscarriage), tissue left behind may become infected and must be removed. Usually, a dilation and curettage (D and C) procedure is performed.  During a D and C procedure, the cervix is widened (dilated) and any remaining fetal or placental tissue is gently removed from the uterus.  Antibiotic medicines are prescribed if there is an infection. Other medicines may be given to reduce the size of the uterus (contract) if there is a lot of bleeding.  If you have Rh negative blood and your baby was Rh positive, you will need a Rh immunoglobulin shot. This shot will protect any future baby from having Rh blood problems in future pregnancies. HOME CARE INSTRUCTIONS   Your caregiver may order bed rest or may allow you to continue light activity. Resume activity as directed by your caregiver.  Have someone help with home and family responsibilities during this time.   Keep track of the number of sanitary pads you use each day and how soaked (saturated) they are. Write down this information.   Do not use tampons. Do not douche or have sexual intercourse until approved by your caregiver.   Only take over-the-counter or prescription medicines for pain or discomfort as directed by your caregiver.   Do not take aspirin. Aspirin can cause bleeding.   Keep all follow-up appointments with your caregiver.   If you or your partner have problems with grieving, talk to your caregiver or seek counseling to help cope with the pregnancy loss. Allow enough time to grieve before trying to get pregnant again.  SEEK IMMEDIATE MEDICAL CARE IF:   You have severe cramps or pain in your back or abdomen.  You have a fever.  You pass large blood clots (walnut-sized   or larger) ortissue from your vagina. Save any tissue for your caregiver to inspect.   Your bleeding increases.   You have a thick, bad-smelling vaginal discharge.  You become lightheaded, weak, or you faint.   You have chills.  MAKE SURE YOU:  Understand these instructions.  Will watch your condition.  Will get help right away if you are not doing well or get  worse. Document Released: 10/21/2000 Document Revised: 08/22/2012 Document Reviewed: 06/16/2011 Exit[employer] Patient Information 2014 [employer].  

## 2013-05-27 NOTE — Progress Notes (Signed)
Donette LarryMelanie Bhambri notified of pt's arrival and complaints , orders received.

## 2013-05-27 NOTE — MAU Provider Note (Signed)
  History   Bleeding today.  CSN: [id number]  Arrival date and time: 05/27/13 1528   None     Chief Complaint  Patient presents with  . Vaginal Bleeding  . Threatened Miscarriage   HPI  OB History   Grav Para Term Preterm Abortions TAB SAB Ect Mult Living   6 3 2  0 2 1 1  0 0 2      Past Medical History  Diagnosis Date  . No pertinent past medical history   . Postpartum care and examination of lactating mother 11/20/2010    Past Surgical History  Procedure Laterality Date  . Hemorrhoid surgery      after delivery  . Cervical cerclage      History reviewed. No pertinent family history.  History  Substance Use Topics  . Smoking status: Never Smoker   . Smokeless tobacco: Never Used  . Alcohol Use: No    Allergies: No Known Allergies  Prescriptions prior to admission  Medication Sig Dispense Refill  . Prenatal Vit-Fe Fumarate-FA (PRENATAL MULTIVITAMIN) TABS tablet Take 1 tablet by mouth daily at 12 noon.      . progesterone (PROMETRIUM) 200 MG capsule Take 200 mg by mouth daily.      . TDaP (ADACEL) 09-09-13.5 LF-MCG/0.5 injection Inject 0.5 mLs into the muscle once.  0.5 mL  0    ROS Physical Exam   Blood pressure 135/84, pulse 91, temperature 98.6 F (37 C), resp. rate 18, last menstrual period 03/16/2013.  Physical Exam  MAU Course  Procedures  MDM na  Assessment and Plan  Inevitable AB DC home with bleeding precautions  Rickie Gutierres J 05/27/2013, 5:16 PM

## 2013-05-27 NOTE — MAU Note (Signed)
Pt presents with large amount of bright red vaginal bleeding. She has been evaluated by Dr Seymour BarsLavoie and states that she was told that she may be having a miscarriage this week. She states that at work today she started bleeding heavy and came in to be evaluated.

## 2013-10-30 ENCOUNTER — Encounter (HOSPITAL_COMMUNITY): Payer: Self-pay | Admitting: Emergency Medicine

## 2013-10-30 ENCOUNTER — Emergency Department (HOSPITAL_COMMUNITY)
Admission: EM | Admit: 2013-10-30 | Discharge: 2013-10-31 | Disposition: A | Discharge status: Home / Self Care | Payer: Commercial Managed Care - PPO | Attending: Emergency Medicine | Admitting: Emergency Medicine

## 2013-10-30 DIAGNOSIS — Z23 Encounter for immunization: Secondary | ICD-10-CM | POA: Insufficient documentation

## 2013-10-30 DIAGNOSIS — Z86711 Personal history of pulmonary embolism: Secondary | ICD-10-CM | POA: Insufficient documentation

## 2013-10-30 DIAGNOSIS — Z86718 Personal history of other venous thrombosis and embolism: Secondary | ICD-10-CM | POA: Insufficient documentation

## 2013-10-30 DIAGNOSIS — R0789 Other chest pain: Secondary | ICD-10-CM | POA: Insufficient documentation

## 2013-10-30 DIAGNOSIS — Z79899 Other long term (current) drug therapy: Secondary | ICD-10-CM | POA: Insufficient documentation

## 2013-10-30 DIAGNOSIS — E7800 Pure hypercholesterolemia, unspecified: Secondary | ICD-10-CM | POA: Insufficient documentation

## 2013-10-30 LAB — COMPREHENSIVE METABOLIC PANEL
ALK PHOS: 78 U/L (ref 39–117)
ALT: 26 U/L (ref 0–35)
AST: 21 U/L (ref 0–37)
Albumin: 4 g/dL (ref 3.5–5.2)
BUN: 13 mg/dL (ref 6–23)
CHLORIDE: 103 meq/L (ref 96–112)
CO2: 24 mEq/L (ref 19–32)
Calcium: 9.1 mg/dL (ref 8.4–10.5)
Creatinine, Ser: 0.65 mg/dL (ref 0.50–1.10)
GFR calc non Af Amer: >90 mL/min (ref >90)
GLUCOSE: 83 mg/dL (ref 70–99)
POTASSIUM: 4.1 meq/L (ref 3.7–5.3)
Sodium: 139 mEq/L (ref 137–147)
Total Bilirubin: <0.2 mg/dL — ABNORMAL LOW (ref 0.3–1.2)
Total Protein: 7.5 g/dL (ref 6.0–8.3)

## 2013-10-30 LAB — CBC
HEMATOCRIT: 37.9 % (ref 36.0–46.0)
HEMOGLOBIN: 12.6 g/dL (ref 12.0–15.0)
MCH: 28.9 pg (ref 26.0–34.0)
MCHC: 33.2 g/dL (ref 30.0–36.0)
MCV: 86.9 fL (ref 78.0–100.0)
Platelets: 294 10*3/uL (ref 150–400)
RBC: 4.36 MIL/uL (ref 3.87–5.11)
RDW: 14 % (ref 11.5–15.5)
WBC: 6.7 10*3/uL (ref 4.0–10.5)

## 2013-10-30 LAB — I-STAT TROPONIN, ED: TROPONIN I, POC: 0 ng/mL (ref 0.00–0.08)

## 2013-10-30 NOTE — ED Notes (Signed)
Pt presents with c/o left sided chest pain that started approx one hour ago. Pt says that the pain feels like pressure on her chest. Pt says that she felt like her heart was beating very fast when the chest pain started, HR of 78 at this time. Pt has no other complaints besides the chest pain.

## 2013-10-31 ENCOUNTER — Emergency Department (HOSPITAL_COMMUNITY): Payer: Commercial Managed Care - PPO

## 2013-10-31 LAB — I-STAT TROPONIN, ED: Troponin i, poc: 0 ng/mL (ref 0.00–0.08)

## 2013-10-31 NOTE — ED Provider Notes (Signed)
Second troponin negative   Patient discharged per previously presented plan   Arman FilterGail K Schulz, NP 10/31/13 0246

## 2013-10-31 NOTE — ED Provider Notes (Signed)
Medical screening examination/treatment/procedure(s) were performed by non-physician practitioner and as supervising physician I was immediately available for consultation/collaboration.   EKG Interpretation None       April K Palumbo-Rasch, MD 10/31/13 [phone]

## 2013-10-31 NOTE — ED Provider Notes (Signed)
Medical screening examination/treatment/procedure(s) were performed by non-physician practitioner and as supervising physician I was immediately available for consultation/collaboration.   EKG Interpretation None        Kevin M Campos, MD 10/31/13 1437 

## 2013-10-31 NOTE — ED Provider Notes (Signed)
CSN: [id number]     Arrival date & time 10/30/13  2129 History   First MD Initiated Contact with Patient 10/30/13 2348     Chief Complaint  Patient presents with  . Chest Pain     (Consider location/radiation/quality/duration/timing/severity/associated sxs/prior Treatment) HPI Comments: Patient with past history of high cholesterol controlled with diet presents with left-sided chest pain that began at 8:30 PM while at rest. Pain was described as a pressure. It did not radiate. It was associated with nausea but no vomiting. No shortness of breath. Patient describes a racing heart at the onset of pain. She states that she was nervous. Patient denies risk factors for pulmonary embolism including: unilateral leg swelling, history of DVT/PE/other blood clots, use of estrogens, recent immobilizations, recent surgery, recent travel (>4hr segment), malignancy, hemoptysis. No history of hypertension, diabetes, tobacco use, family history of early ACS. Mother had a "large heart". Pain is not worse with deep breathing or palpation. Not worse with eating. She has not had similar pain in the past. The onset of this condition was acute. The course is constant. Aggravating factors: none. Alleviating factors: none.      Patient is a 38 y.o. female presenting with chest pain. The history is provided by the patient.  Chest Pain Associated symptoms: nausea and palpitations   Associated symptoms: no abdominal pain, no back pain, no cough, no diaphoresis, no fever, no shortness of breath and not vomiting     Past Medical History  Diagnosis Date  . No pertinent past medical history   . Postpartum care and examination of lactating mother 11/20/2010   Past Surgical History  Procedure Laterality Date  . Hemorrhoid surgery      after delivery  . Cervical cerclage     No family history on file. History  Substance Use Topics  . Smoking status: Never Smoker   . Smokeless tobacco: Never Used  . Alcohol Use: No    OB History   Grav Para Term Preterm Abortions TAB SAB Ect Mult Living   6 3 2  0 2 1 1  0 0 2     Review of Systems  Constitutional: Negative for fever and diaphoresis.  Eyes: Negative for redness.  Respiratory: Negative for cough and shortness of breath.   Cardiovascular: Positive for chest pain and palpitations. Negative for leg swelling.  Gastrointestinal: Positive for nausea. Negative for vomiting and abdominal pain.  Genitourinary: Negative for dysuria.  Musculoskeletal: Negative for back pain and neck pain.  Skin: Negative for rash.  Neurological: Negative for syncope and light-headedness.   Allergies  Review of patient's allergies indicates no known allergies.  Home Medications   Prior to Admission medications   Medication Sig Start Date End Date Taking? Authorizing Chance Munter  Prenatal Vit-Fe Fumarate-FA (PRENATAL MULTIVITAMIN) TABS tablet Take 1 tablet by mouth daily at 12 noon.    Historical Caylea Foronda, MD  progesterone (PROMETRIUM) 200 MG capsule Take 200 mg by mouth daily.    Historical Verne Lanuza, MD  TDaP (ADACEL) 09-09-13.5 LF-MCG/0.5 injection Inject 0.5 mLs into the muscle once. 11/21/10   Arlan Organaniela Paul, CNM   BP 133/86  Pulse 76  Temp(Src) 97.9 F (36.6 C) (Oral)  Resp 16  SpO2 100%  LMP 10/20/2013  Breastfeeding? Unknown  Physical Exam  Nursing note and vitals reviewed. Constitutional: She appears well-developed and well-nourished.  HENT:  Head: Normocephalic and atraumatic.  Right Ear: External ear normal.  Left Ear: External ear normal.  Nose: Nose normal.  Mouth/Throat: Oropharynx is clear and  moist and mucous membranes are normal. Mucous membranes are not dry. No oropharyngeal exudate.  Eyes: Conjunctivae are normal.  Neck: Trachea normal and normal range of motion. Neck supple. Normal carotid pulses and no JVD present. No muscular tenderness present. Carotid bruit is not present. No tracheal deviation present.  Cardiovascular: Normal rate, regular  rhythm, S1 normal, S2 normal, normal heart sounds and intact distal pulses.  Exam reveals no decreased pulses.   No murmur heard. Pulses:      Radial pulses are 2+ on the right side, and 2+ on the left side.  Pulmonary/Chest: Effort normal. No respiratory distress. She has no wheezes. She exhibits no tenderness.  Abdominal: Soft. Normal aorta and bowel sounds are normal. There is no tenderness. There is no rebound and no guarding.  Musculoskeletal: Normal range of motion.  Neurological: She is alert.  Skin: Skin is warm and dry. She is not diaphoretic. No cyanosis. No pallor.  Psychiatric: She has a normal mood and affect.    ED Course  Procedures (including critical care time) Labs Review Labs Reviewed  COMPREHENSIVE METABOLIC PANEL - Abnormal; Notable for the following:    Total Bilirubin <0.2 (*)    All other components within normal limits  CBC  I-STAT TROPOININ, ED  Rosezena SensorI-STAT TROPOININ, ED    Imaging Review Dg Chest 2 View  10/31/2013   CLINICAL DATA:  Shortness of breath. Chest pain. Cough. Congestion.  EXAM: CHEST  2 VIEW  COMPARISON:  None.  FINDINGS: Low lung volumes are present, causing crowding of the pulmonary vasculature.  Accounting for the low lung volumes, suspect that the heart size is normal. Lungs appear clear. No pleural effusion.  IMPRESSION: Low lung volumes. Otherwise, no significant abnormalities are observed.   Electronically Signed   By: Herbie BaltimoreWalt  Liebkemann M.D.   On: 10/31/2013 01:01     EKG Interpretation None      12:10 AM Patient seen and examined. EKG reviewed. No signs of ischemia. Work-up initiated.    Vital signs reviewed and are as follows: Filed Vitals:   10/30/13 2158  BP: 133/86  Pulse: 76  Temp: 97.9 F (36.6 C)  Resp: 16    Date: 10/31/2013  Rate: 75  Rhythm: normal sinus rhythm  QRS Axis: normal  Intervals: normal  ST/T Wave abnormalities: normal  Conduction Disutrbances:none  Narrative Interpretation: slight inversion III,  otherwise unremarkable  Old EKG Reviewed: none available  1:06 AM Handoff to Manus RuddSchulz NP at shift change. If delta trop neg, patient can f/u with PCP Dr. Lily PeerFernandez. Very low risk for ACS. No concerning family history. PERC neg. CXR neg.    MDM   Final diagnoses:  Other chest pain   Patient with chest tightness. Feel patient is low risk for ACS given history (poor story for ACS/MI), negative troponin(s), normal/unchanged EKG. PERC neg. CXR nml. Patient appears very well. Feel she is safe for d/c to home with PCP f/u if 2nd troponin neg. No indications for admission based on history, PE, risk factor profile.   No dangerous or life-threatening conditions suspected or identified by history, physical exam, and by work-up. No indications for hospitalization identified.        Renne CriglerJoshua Geiple, PA-C 10/31/13 [phone]

## 2013-10-31 NOTE — Discharge Instructions (Signed)
Please read and follow all provided instructions.  Your diagnoses today include:  1. Other chest pain    Tests performed today include:  An EKG of your heart  A chest x-ray  Cardiac enzymes - a blood test for heart muscle damage  Blood counts and electrolytes  Vital signs. See below for your results today.   Medications prescribed:   None  Take any prescribed medications only as directed.  Follow-up instructions: Please follow-up with your primary care provider as soon as you can for further evaluation of your symptoms. If you do not have a primary care doctor -- see below for referral information.   Return instructions:  SEEK IMMEDIATE MEDICAL ATTENTION IF:  You have severe chest pain, especially if the pain is crushing or pressure-like and spreads to the arms, back, neck, or jaw, or if you have sweating, nausea (feeling sick to your stomach), or shortness of breath. THIS IS AN EMERGENCY. Don't wait to see if the pain will go away. Get medical help at once. Call 911 or 0 (operator). DO NOT drive yourself to the hospital.   Your chest pain gets worse and does not go away with rest.   You have an attack of chest pain lasting longer than usual, despite rest and treatment with the medications your caregiver has prescribed.   You wake from sleep with chest pain or shortness of breath.  You feel dizzy or faint.  You have chest pain not typical of your usual pain for which you originally saw your caregiver.   You have any other emergent concerns regarding your health.  Additional Information: Chest pain comes from many different causes. Your caregiver has diagnosed you as having chest pain that is not specific for one problem, but does not require admission.  You are at low risk for an acute heart condition or other serious illness.   Your vital signs today were: BP 133/86   Pulse 76   Temp(Src) 97.9 F (36.6 C) (Oral)   Resp 16   SpO2 100%   LMP 10/20/2013   Breastfeeding?  No If your blood pressure (BP) was elevated above 135/85 this visit, please have this repeated by your doctor within one month. --------------

## 2014-01-30 LAB — OB RESULTS CONSOLE HIV ANTIBODY (ROUTINE TESTING): HIV: NONREACTIVE

## 2014-01-30 LAB — OB RESULTS CONSOLE RPR: RPR: NONREACTIVE

## 2014-01-30 LAB — OB RESULTS CONSOLE GC/CHLAMYDIA
Chlamydia: NEGATIVE
GC PROBE AMP, GENITAL: NEGATIVE

## 2014-01-30 LAB — OB RESULTS CONSOLE RUBELLA ANTIBODY, IGM: RUBELLA: IMMUNE

## 2014-01-30 LAB — OB RESULTS CONSOLE HEPATITIS B SURFACE ANTIGEN: HEP B S AG: NEGATIVE

## 2014-02-07 ENCOUNTER — Encounter (HOSPITAL_COMMUNITY): Payer: Self-pay | Admitting: Pharmacist

## 2014-02-13 ENCOUNTER — Other Ambulatory Visit: Payer: Self-pay | Admitting: Obstetrics & Gynecology

## 2014-02-21 ENCOUNTER — Encounter (HOSPITAL_COMMUNITY)
Admission: RE | Disposition: A | Discharge status: Home / Self Care | Payer: Self-pay | Source: Ambulatory Visit | Attending: Obstetrics & Gynecology

## 2014-02-21 ENCOUNTER — Encounter (HOSPITAL_COMMUNITY): Payer: Self-pay

## 2014-02-21 ENCOUNTER — Ambulatory Visit (HOSPITAL_COMMUNITY): Payer: Commercial Managed Care - PPO | Admitting: Anesthesiology

## 2014-02-21 ENCOUNTER — Ambulatory Visit (HOSPITAL_COMMUNITY)
Admission: RE | Admit: 2014-02-21 | Discharge: 2014-02-21 | Disposition: A | Discharge status: Home / Self Care | Payer: Commercial Managed Care - PPO | Source: Ambulatory Visit | Attending: Obstetrics & Gynecology | Admitting: Obstetrics & Gynecology

## 2014-02-21 ENCOUNTER — Encounter (HOSPITAL_COMMUNITY): Payer: Commercial Managed Care - PPO | Admitting: Anesthesiology

## 2014-02-21 DIAGNOSIS — Z3A12 12 weeks gestation of pregnancy: Secondary | ICD-10-CM | POA: Diagnosis not present

## 2014-02-21 DIAGNOSIS — O3431 Maternal care for cervical incompetence, first trimester: Secondary | ICD-10-CM | POA: Insufficient documentation

## 2014-02-21 HISTORY — PX: CERVICAL CERCLAGE: SHX1329

## 2014-02-21 LAB — CBC
HCT: 37.7 % (ref 36.0–46.0)
HEMOGLOBIN: 12.7 g/dL (ref 12.0–15.0)
MCH: 29.9 pg (ref 26.0–34.0)
MCHC: 33.7 g/dL (ref 30.0–36.0)
MCV: 88.7 fL (ref 78.0–100.0)
Platelets: 263 10*3/uL (ref 150–400)
RBC: 4.25 MIL/uL (ref 3.87–5.11)
RDW: 13.7 % (ref 11.5–15.5)
WBC: 7 10*3/uL (ref 4.0–10.5)

## 2014-02-21 LAB — TYPE AND SCREEN
ABO/RH(D): O POS
ANTIBODY SCREEN: NEGATIVE

## 2014-02-21 SURGERY — CERCLAGE, CERVIX, VAGINAL APPROACH
Anesthesia: Spinal | Site: Vagina

## 2014-02-21 MED ORDER — EPHEDRINE 5 MG/ML INJ
INTRAVENOUS | Status: AC
  Filled 2014-02-21: qty 10

## 2014-02-21 MED ORDER — SCOPOLAMINE 1 MG/3DAYS TD PT72
1.0000 | MEDICATED_PATCH | Freq: Once | TRANSDERMAL | Status: DC
  Administered 2014-02-21: 1.5 mg via TRANSDERMAL

## 2014-02-21 MED ORDER — SCOPOLAMINE 1 MG/3DAYS TD PT72
MEDICATED_PATCH | TRANSDERMAL | Status: AC
  Administered 2014-02-21: 1.5 mg via TRANSDERMAL
  Filled 2014-02-21: qty 1

## 2014-02-21 MED ORDER — CEFAZOLIN SODIUM-DEXTROSE 2-3 GM-% IV SOLR
INTRAVENOUS | Status: AC
  Filled 2014-02-21: qty 50

## 2014-02-21 MED ORDER — LACTATED RINGERS IV SOLN
INTRAVENOUS | Status: DC
Start: 2014-02-21 — End: 2014-02-21
  Administered 2014-02-21 (×2): via INTRAVENOUS

## 2014-02-21 MED ORDER — MIDAZOLAM HCL 2 MG/2ML IJ SOLN
INTRAMUSCULAR | Status: AC
  Filled 2014-02-21: qty 2

## 2014-02-21 MED ORDER — CEFAZOLIN SODIUM-DEXTROSE 2-3 GM-% IV SOLR
2.0000 g | INTRAVENOUS | Status: AC
  Administered 2014-02-21: 2 g via INTRAVENOUS

## 2014-02-21 MED ORDER — FENTANYL CITRATE 0.05 MG/ML IJ SOLN: 25.0000 ug | INTRAMUSCULAR | Status: DC | PRN

## 2014-02-21 MED ORDER — EPHEDRINE SULFATE 50 MG/ML IJ SOLN
INTRAMUSCULAR | Status: DC | PRN
  Administered 2014-02-21: 10 mg via INTRAVENOUS

## 2014-02-21 SURGICAL SUPPLY — 21 items
CATH FOLEY 2WAY SLVR 30CC 16FR (CATHETERS) IMPLANT
CLOTH BEACON ORANGE TIMEOUT ST (SAFETY) ×3 IMPLANT
COUNTER NEEDLE 1200 MAGNETIC (NEEDLE) IMPLANT
GLOVE BIO SURGEON STRL SZ 6.5 (GLOVE) ×2 IMPLANT
GLOVE BIO SURGEONS STRL SZ 6.5 (GLOVE) ×1
GLOVE BIOGEL PI IND STRL 7.0 (GLOVE) ×1 IMPLANT
GLOVE BIOGEL PI INDICATOR 7.0 (GLOVE) ×2
GOWN STRL REUS W/TWL LRG LVL3 (GOWN DISPOSABLE) ×6 IMPLANT
NEEDLE MAYO .5 CIRCLE (NEEDLE) ×3 IMPLANT
PACK VAGINAL MINOR WOMEN LF (CUSTOM PROCEDURE TRAY) ×3 IMPLANT
PAD OB MATERNITY 4.3X12.25 (PERSONAL CARE ITEMS) ×3 IMPLANT
PAD PREP 24X48 CUFFED NSTRL (MISCELLANEOUS) ×3 IMPLANT
SUT MERSILENE 5MM BP 1 12 (SUTURE) ×3 IMPLANT
SUT PROLENE 0 CT 1 30 (SUTURE) ×3 IMPLANT
SYR 30ML LL (SYRINGE) IMPLANT
TOWEL OR 17X24 6PK STRL BLUE (TOWEL DISPOSABLE) ×6 IMPLANT
TRAY FOLEY CATH 14FR (SET/KITS/TRAYS/PACK) ×3 IMPLANT
TUBING NON-CON 1/4 X 20 CONN (TUBING) IMPLANT
TUBING NON-CON 1/4 X 20' CONN (TUBING)
WATER STERILE IRR 1000ML POUR (IV SOLUTION) ×6 IMPLANT
YANKAUER SUCT BULB TIP NO VENT (SUCTIONS) IMPLANT

## 2014-02-21 NOTE — Op Note (Signed)
02/21/2014  1:00 PM  PATIENT:  [name]  38 y.o. female  PRE-OPERATIVE DIAGNOSIS:  Cervical Incompetence, 12 wks  POST-OPERATIVE DIAGNOSIS:  Cervical Incompetence, 12 wks  PROCEDURE:  Procedure(s): Modified McDonald CERVICAL CERCLAGE   SURGEON:  Surgeon(s): Stacey DelMarie-Lyne Guila Owensby, MD  ASSISTANTS: none   ANESTHESIA:   spinal  PROCEDURE:  Under spinal anesthesia, the patient is in lithotomy position.  She is prepped with Betadine on supra-pubic, vulvar and vaginal areas.  She is draped as usual.  The vaginal exam reveals a closed/long/firm cervix.  The weighted speculum is inserted in the vagina.  The anterior lip of the cervix is grasped with an opened fenestrated clamp.  We start at 12 O'clock at the junction of the vaginal mucosa and cervix, placing the Mersylene suture all around the cervix in an anti-clock-wise fashion.  A prolene is inserted at the base of the Mersylene knot at 12 O'clock.  The tension of the Mersylene cerclage was verified and adequate.  Hemostasis is adequate.  The patient is brought to the recovery room in good and stable status.  ESTIMATED BLOOD LOSS: 10 cc   Intake/Output Summary (Last 24 hours) at 02/21/14 1300 Last data filed at 02/21/14 1255  Gross per 24 hour  Intake   1000 ml  Output    700 ml  Net    300 ml     BLOOD ADMINISTERED:none   LOCAL MEDICATIONS USED:  NONE  SPECIMEN:  No Specimen  DISPOSITION OF SPECIMEN:  N/A  COUNTS:  YES  PLAN OF CARE: Transfer to PACU   Stacey DelMarie-Lyne Michaelle Bottomley MD  02/21/2014 at 1:00 pm

## 2014-02-21 NOTE — Discharge Summary (Signed)
  Physician Discharge Summary  Patient ID: [name] MRN: [id number] DOB/AGE: 37/09/1975 37 y.o.  Admit date: 02/21/2014 Discharge date: 02/21/2014  Admission Diagnoses: Cervical Incompetence, 12 wks  Discharge Diagnoses: Cervical Incompetence, 12 wks        Active Problems:   * No active hospital problems. *   Discharged Condition: good  Hospital Course: Outpatient  Consults: None  Treatments: surgery: Modified McDonald cervical cerclage  Disposition: 01-Home or Self Care     Medication List         aspirin EC 81 MG tablet  Take 81 mg by mouth daily.     prenatal multivitamin Tabs tablet  Take 1 tablet by mouth daily at 12 noon.     progesterone 200 MG capsule  Commonly known as:  PROMETRIUM  Take 200 mg by mouth at bedtime.           Follow-up Information   Follow up with Ervin Hensley,MARIE-LYNE, MD In 2 weeks.   Specialty:  Obstetrics and Gynecology   Contact information:   [address] [place] [id number] [phone]       Signed: Genia DelLAVOIE,MARIE-LYNE, MD 02/21/2014, 1:05 PM

## 2014-02-21 NOTE — Discharge Instructions (Signed)
°  Cervical Cerclage, Care After °Refer to this sheet in the next few weeks. These instructions provide you with information on caring for yourself after your procedure. Your health care provider may also give you more specific instructions. Your treatment has been planned according to current medical practices, but problems sometimes occur. Call your health care provider if you have any problems or questions after your procedure. °WHAT TO EXPECT AFTER THE PROCEDURE  °After your procedure, it is typical to have the following: °· Abdominal cramping. °· Vaginal spotting. °HOME CARE INSTRUCTIONS  °· Only take over-the-counter or prescription medicines for pain, discomfort, or fever as directed by your health care provider. °· Avoid physical activities and exercise until your health care provider says it is okay. °· Do not douche or have sexual intercourse until your health care provider tells you it is okay. °· Keep your follow-up surgical and prenatal appointments with your health care provider. °SEEK MEDICAL CARE IF:  °· You have abnormal vaginal discharge. °· You have a rash. °· You become lightheaded or feel faint. °· You have abdominal pain that is not controlled with pain medicine. °SEEK IMMEDIATE MEDICAL CARE IF:  °· You develop vaginal bleeding. °· You are leaking fluid or have a gush of fluid from the vagina. °· You have a fever. °· You faint. °· You have uterine contractions. °· You feel your baby is not moving as much as usual, or you cannot feel your baby move. °· You have chest pain or shortness of breath. °Document Released: 02/15/2013 Document Revised: 05/02/2013 Document Reviewed: 02/15/2013 °ExitCare® Patient Information ©2015 [employer]. This information is not intended to replace advice given to you by your health care provider. Make sure you discuss any questions you have with your health care provider. ° °

## 2014-02-21 NOTE — Transfer of Care (Signed)
Immediate Anesthesia Transfer of Care Note  Patient: [name]  Procedure(s) Performed: Procedure(s) with comments: Modified McDonald CERVICAL CERCLAGE  (N/A) - EDD: 09/05/14  Patient Location: PACU  Anesthesia Type:Regional  Level of Consciousness: awake, alert , oriented and patient cooperative  Airway & Oxygen Therapy: Patient Spontanous Breathing  Post-op Assessment: Report given to PACU RN and Post -op Vital signs reviewed and stable  Post vital signs: Reviewed and stable  Complications: No apparent anesthesia complications

## 2014-02-21 NOTE — H&P (Addendum)
[name] [name] is an 38 y.o. female [id number]  RP:  Cervical incompetence for cerclage at 12 wks  Pertinent Gynecological History:  Blood transfusions: none Sexually transmitted diseases: no past history Last pap: normal  OB History: G7P3L2  Menstrual History:  Patient's last menstrual period was 03/16/2013.    Past Medical History  Diagnosis Date  . No pertinent past medical history   . Postpartum care and examination of lactating mother 11/20/2010    Past Surgical History  Procedure Laterality Date  . Hemorrhoid surgery      after delivery  . Cervical cerclage      No family history on file.  Social History:  reports that she has never smoked. She has never used smokeless tobacco. She reports that she does not drink alcohol or use illicit drugs.  Allergies: No Known Allergies  Prescriptions prior to admission  Medication Sig Dispense Refill  . aspirin EC 81 MG tablet Take 81 mg by mouth daily.      . Prenatal Vit-Fe Fumarate-FA (PRENATAL MULTIVITAMIN) TABS tablet Take 1 tablet by mouth daily at 12 noon.      . progesterone (PROMETRIUM) 200 MG capsule Take 200 mg by mouth at bedtime.        ROS Neg  Physical Exam  No results found for this or any previous visit (from the past 24 hour(s)).  No results found.  Assessment/Plan: 12 wks with cervical incompetence for Modified McDonald cervical cerclage.  Surgery and risks reviewed.  Nik Gorrell,MARIE-LYNE 02/21/2014, 10:46 AM

## 2014-02-21 NOTE — Anesthesia Preprocedure Evaluation (Signed)
Anesthesia Evaluation  Patient identified by MRN, date of birth, ID band Patient awake    Reviewed: Allergy & Precautions, H&P , NPO status , Patient's Chart, lab work & pertinent test results  Airway Mallampati: II TM Distance: >3 FB Neck ROM: full    Dental no notable dental hx.    Pulmonary neg pulmonary ROS,  breath sounds clear to auscultation  Pulmonary exam normal       Cardiovascular negative cardio ROS      Neuro/Psych negative neurological ROS  negative psych ROS   GI/Hepatic negative GI ROS, Neg liver ROS,   Endo/Other  negative endocrine ROS  Renal/GU negative Renal ROS     Musculoskeletal   Abdominal Normal abdominal exam  (+)   Peds  Hematology negative hematology ROS (+)   Anesthesia Other Findings   Reproductive/Obstetrics (+) Pregnancy                           Anesthesia Physical Anesthesia Plan  ASA: II  Anesthesia Plan: Spinal   Post-op Pain Management:    Induction:   Airway Management Planned:   Additional Equipment:   Intra-op Plan:   Post-operative Plan:   Informed Consent: I have reviewed the patients History and Physical, chart, labs and discussed the procedure including the risks, benefits and alternatives for the proposed anesthesia with the patient or authorized representative who has indicated his/her understanding and acceptance.     Plan Discussed with: CRNA and Surgeon  Anesthesia Plan Comments:         Anesthesia Quick Evaluation

## 2014-02-21 NOTE — Anesthesia Postprocedure Evaluation (Signed)
  Anesthesia Post Note  Patient: [name]  Procedure(s) Performed: Procedure(s) (LRB): Modified McDonald CERVICAL CERCLAGE  (N/A)  Anesthesia type: Spinal  Patient location: PACU  Post pain: Pain level controlled  Post assessment: Post-op Vital signs reviewed  Last Vitals:  Filed Vitals:   02/21/14 1545  BP: 121/94  Pulse: 97  Temp: 36.3 C  Resp: 20    Post vital signs: Reviewed  Level of consciousness: awake  Complications: No apparent anesthesia complications

## 2014-02-22 ENCOUNTER — Encounter (HOSPITAL_COMMUNITY): Payer: Self-pay | Admitting: Obstetrics & Gynecology

## 2014-02-22 MED ORDER — BUPIVACAINE IN DEXTROSE 0.75-8.25 % IT SOLN
INTRATHECAL | Status: DC | PRN
  Administered 2014-02-21: 1.4 mL via INTRATHECAL

## 2014-02-22 NOTE — Anesthesia Postprocedure Evaluation (Addendum)
Anesthesia Post Note  Patient: [name]  Procedure(s) Performed: Procedure(s) (LRB): Modified McDonald CERVICAL CERCLAGE  (N/A)  Anesthesia type: Spinal  Patient location: PACU  Post pain: Pain level controlled  Post assessment: Post-op Vital signs reviewed  Last Vitals:  Filed Vitals:   02/21/14 1750  BP: 105/71  Pulse: 81  Temp: 36.4 C  Resp: 16    Post vital signs: Reviewed  Level of consciousness: awake  Complications: No apparent anesthesia complications

## 2014-02-22 NOTE — Anesthesia Procedure Notes (Addendum)
Spinal  Patient location during procedure: OR Start time: 02/21/2014 12:35 PM End time: 02/21/2014 12:37 PM Staffing Anesthesiologist: Leilani AbleHATCHETT, Yousaf Sainato Performed by: anesthesiologist  Preanesthetic Checklist Completed: patient identified, surgical consent, pre-op evaluation, timeout performed, IV checked, risks and benefits discussed and monitors and equipment checked Spinal Block Patient position: sitting Prep: DuraPrep Patient monitoring: heart rate, cardiac monitor, continuous pulse ox and blood pressure Approach: midline Location: L3-4 Injection technique: single-shot Needle Needle type: Pencan  Needle gauge: 24 G Needle length: 9 cm Needle insertion depth: 6 cm Assessment Sensory level: T8

## 2014-03-12 ENCOUNTER — Encounter (HOSPITAL_COMMUNITY): Payer: Self-pay | Admitting: Obstetrics & Gynecology

## 2014-05-11 NOTE — L&D Delivery Note (Signed)
Delivery Note At 1:15 AM a viable and healthy female was delivered via Vaginal, Spontaneous Delivery (Presentation: Left Occiput Anterior).  APGAR: 9, 9; weight  pending.   Placenta status: Intact, Spontaneous.  Cord: 3 vessels with the following complications: None.  Cord pH: na  Anesthesia: Epidural  Episiotomy: None Lacerations: 2nd degree Suture Repair: 2.0 vicryl rapide Est. Blood Loss (mL):  200  Mom to postpartum.  Baby to Couplet care / Skin to Skin.  William Laske J 08/31/2014, 1:30 AM

## 2014-08-07 LAB — OB RESULTS CONSOLE GBS: GBS: NEGATIVE

## 2014-08-30 ENCOUNTER — Inpatient Hospital Stay (HOSPITAL_COMMUNITY)
Admission: AD | Admit: 2014-08-30 | Discharge: 2014-09-01 | DRG: 775 | Disposition: A | Discharge status: Home / Self Care | Payer: Commercial Managed Care - PPO | Source: Ambulatory Visit | Attending: Obstetrics and Gynecology | Admitting: Obstetrics and Gynecology

## 2014-08-30 DIAGNOSIS — Z3A39 39 weeks gestation of pregnancy: Secondary | ICD-10-CM | POA: Diagnosis present

## 2014-08-30 DIAGNOSIS — Z3490 Encounter for supervision of normal pregnancy, unspecified, unspecified trimester: Secondary | ICD-10-CM

## 2014-08-30 DIAGNOSIS — O09523 Supervision of elderly multigravida, third trimester: Secondary | ICD-10-CM

## 2014-08-31 ENCOUNTER — Encounter (HOSPITAL_COMMUNITY): Payer: Self-pay

## 2014-08-31 ENCOUNTER — Inpatient Hospital Stay (HOSPITAL_COMMUNITY): Payer: Commercial Managed Care - PPO | Admitting: Anesthesiology

## 2014-08-31 DIAGNOSIS — O09523 Supervision of elderly multigravida, third trimester: Secondary | ICD-10-CM | POA: Diagnosis not present

## 2014-08-31 DIAGNOSIS — Z3490 Encounter for supervision of normal pregnancy, unspecified, unspecified trimester: Secondary | ICD-10-CM

## 2014-08-31 DIAGNOSIS — Z3A39 39 weeks gestation of pregnancy: Secondary | ICD-10-CM | POA: Diagnosis present

## 2014-08-31 DIAGNOSIS — O9989 Other specified diseases and conditions complicating pregnancy, childbirth and the puerperium: Secondary | ICD-10-CM | POA: Diagnosis present

## 2014-08-31 LAB — CBC
HCT: 37.5 % (ref 36.0–46.0)
HEMATOCRIT: 35.8 % — AB (ref 36.0–46.0)
Hemoglobin: 12.1 g/dL (ref 12.0–15.0)
Hemoglobin: 13 g/dL (ref 12.0–15.0)
MCH: 28.9 pg (ref 26.0–34.0)
MCH: 29.5 pg (ref 26.0–34.0)
MCHC: 33.8 g/dL (ref 30.0–36.0)
MCHC: 34.7 g/dL (ref 30.0–36.0)
MCV: 85 fL (ref 78.0–100.0)
MCV: 85.6 fL (ref 78.0–100.0)
PLATELETS: 230 10*3/uL (ref 150–400)
Platelets: 205 10*3/uL (ref 150–400)
RBC: 4.18 MIL/uL (ref 3.87–5.11)
RBC: 4.41 MIL/uL (ref 3.87–5.11)
RDW: 14.5 % (ref 11.5–15.5)
RDW: 14.6 % (ref 11.5–15.5)
WBC: 12.6 10*3/uL — AB (ref 4.0–10.5)
WBC: 7.6 10*3/uL (ref 4.0–10.5)

## 2014-08-31 LAB — RPR: RPR Ser Ql: NONREACTIVE

## 2014-08-31 MED ORDER — OXYCODONE-ACETAMINOPHEN 5-325 MG PO TABS: 2.0000 | ORAL_TABLET | ORAL | Status: DC | PRN

## 2014-08-31 MED ORDER — PRENATAL MULTIVITAMIN CH
1.0000 | ORAL_TABLET | Freq: Every day | ORAL | Status: DC
  Administered 2014-08-31 – 2014-09-01 (×2): 1 via ORAL
  Filled 2014-08-31 (×2): qty 1

## 2014-08-31 MED ORDER — PHENYLEPHRINE 40 MCG/ML (10ML) SYRINGE FOR IV PUSH (FOR BLOOD PRESSURE SUPPORT)
80.0000 ug | PREFILLED_SYRINGE | INTRAVENOUS | Status: DC | PRN
  Filled 2014-08-31: qty 20
  Filled 2014-08-31: qty 2

## 2014-08-31 MED ORDER — TETANUS-DIPHTH-ACELL PERTUSSIS 5-2.5-18.5 LF-MCG/0.5 IM SUSP: 0.5000 mL | Freq: Once | INTRAMUSCULAR | Status: DC

## 2014-08-31 MED ORDER — DIPHENHYDRAMINE HCL 50 MG/ML IJ SOLN
12.5000 mg | INTRAMUSCULAR | Status: DC | PRN
Start: 2014-08-31 — End: 2014-08-31

## 2014-08-31 MED ORDER — LIDOCAINE HCL (PF) 1 % IJ SOLN
30.0000 mL | INTRAMUSCULAR | Status: DC | PRN
  Administered 2014-08-31: 30 mL via SUBCUTANEOUS
  Filled 2014-08-31: qty 30

## 2014-08-31 MED ORDER — ACETAMINOPHEN 325 MG PO TABS: 650.0000 mg | ORAL_TABLET | ORAL | Status: DC | PRN

## 2014-08-31 MED ORDER — OXYTOCIN 40 UNITS IN LACTATED RINGERS INFUSION - SIMPLE MED
62.5000 mL/h | INTRAVENOUS | Status: DC
  Filled 2014-08-31: qty 1000

## 2014-08-31 MED ORDER — ZOLPIDEM TARTRATE 5 MG PO TABS: 5.0000 mg | ORAL_TABLET | Freq: Every evening | ORAL | Status: DC | PRN

## 2014-08-31 MED ORDER — FENTANYL 2.5 MCG/ML BUPIVACAINE 1/10 % EPIDURAL INFUSION (WH - ANES)
14.0000 mL/h | INTRAMUSCULAR | Status: DC | PRN
  Administered 2014-08-31: 14 mL/h via EPIDURAL
  Filled 2014-08-31: qty 125

## 2014-08-31 MED ORDER — ONDANSETRON HCL 4 MG PO TABS: 4.0000 mg | ORAL_TABLET | ORAL | Status: DC | PRN

## 2014-08-31 MED ORDER — LANOLIN HYDROUS EX OINT: TOPICAL_OINTMENT | CUTANEOUS | Status: DC | PRN

## 2014-08-31 MED ORDER — EPHEDRINE 5 MG/ML INJ
10.0000 mg | INTRAVENOUS | Status: DC | PRN
  Filled 2014-08-31: qty 2

## 2014-08-31 MED ORDER — PHENYLEPHRINE 40 MCG/ML (10ML) SYRINGE FOR IV PUSH (FOR BLOOD PRESSURE SUPPORT)
80.0000 ug | PREFILLED_SYRINGE | INTRAVENOUS | Status: DC | PRN
  Filled 2014-08-31: qty 2

## 2014-08-31 MED ORDER — ONDANSETRON HCL 4 MG/2ML IJ SOLN: 4.0000 mg | Freq: Four times a day (QID) | INTRAMUSCULAR | Status: DC | PRN

## 2014-08-31 MED ORDER — WITCH HAZEL-GLYCERIN EX PADS: 1.0000 "application " | MEDICATED_PAD | CUTANEOUS | Status: DC | PRN

## 2014-08-31 MED ORDER — LACTATED RINGERS IV SOLN
500.0000 mL | Freq: Once | INTRAVENOUS | Status: AC
  Administered 2014-08-31: via INTRAVENOUS

## 2014-08-31 MED ORDER — OXYCODONE-ACETAMINOPHEN 5-325 MG PO TABS: 1.0000 | ORAL_TABLET | ORAL | Status: DC | PRN

## 2014-08-31 MED ORDER — METHYLERGONOVINE MALEATE 0.2 MG/ML IJ SOLN: 0.2000 mg | INTRAMUSCULAR | Status: DC | PRN

## 2014-08-31 MED ORDER — DIBUCAINE 1 % RE OINT: 1.0000 "application " | TOPICAL_OINTMENT | RECTAL | Status: DC | PRN

## 2014-08-31 MED ORDER — OXYTOCIN BOLUS FROM INFUSION
500.0000 mL | INTRAVENOUS | Status: DC
  Administered 2014-08-31: 500 mL via INTRAVENOUS

## 2014-08-31 MED ORDER — LACTATED RINGERS IV SOLN: 500.0000 mL | INTRAVENOUS | Status: DC | PRN

## 2014-08-31 MED ORDER — BENZOCAINE-MENTHOL 20-0.5 % EX AERO: 1.0000 "application " | INHALATION_SPRAY | CUTANEOUS | Status: DC | PRN

## 2014-08-31 MED ORDER — DIPHENHYDRAMINE HCL 25 MG PO CAPS: 25.0000 mg | ORAL_CAPSULE | Freq: Four times a day (QID) | ORAL | Status: DC | PRN

## 2014-08-31 MED ORDER — FLEET ENEMA 7-19 GM/118ML RE ENEM: 1.0000 | ENEMA | Freq: Once | RECTAL | Status: DC

## 2014-08-31 MED ORDER — SIMETHICONE 80 MG PO CHEW: 80.0000 mg | CHEWABLE_TABLET | ORAL | Status: DC | PRN

## 2014-08-31 MED ORDER — SENNOSIDES-DOCUSATE SODIUM 8.6-50 MG PO TABS
2.0000 | ORAL_TABLET | ORAL | Status: DC
  Administered 2014-08-31: 2 via ORAL
  Filled 2014-08-31: qty 2

## 2014-08-31 MED ORDER — METHYLERGONOVINE MALEATE 0.2 MG PO TABS: 0.2000 mg | ORAL_TABLET | ORAL | Status: DC | PRN

## 2014-08-31 MED ORDER — CITRIC ACID-SODIUM CITRATE 334-500 MG/5ML PO SOLN: 30.0000 mL | ORAL | Status: DC | PRN

## 2014-08-31 MED ORDER — LACTATED RINGERS IV SOLN
INTRAVENOUS | Status: DC
Start: 2014-08-31 — End: 2014-08-31
  Administered 2014-08-31: via INTRAVENOUS

## 2014-08-31 MED ORDER — FENTANYL 2.5 MCG/ML BUPIVACAINE 1/10 % EPIDURAL INFUSION (WH - ANES)
INTRAMUSCULAR | Status: DC | PRN
  Administered 2014-08-31: 14 mL/h via EPIDURAL

## 2014-08-31 MED ORDER — LIDOCAINE HCL (PF) 1 % IJ SOLN
INTRAMUSCULAR | Status: DC | PRN
  Administered 2014-08-31: 10 mL

## 2014-08-31 MED ORDER — IBUPROFEN 600 MG PO TABS
600.0000 mg | ORAL_TABLET | Freq: Four times a day (QID) | ORAL | Status: DC
  Administered 2014-08-31 – 2014-09-01 (×6): 600 mg via ORAL
  Filled 2014-08-31 (×6): qty 1

## 2014-08-31 MED ORDER — ONDANSETRON HCL 4 MG/2ML IJ SOLN: 4.0000 mg | INTRAMUSCULAR | Status: DC | PRN

## 2014-08-31 NOTE — Lactation Note (Signed)
This note was copied from the chart of [name]. Lactation Consultation Note  Patient Name: Stacey Virgilio Bellingoemi Wu ZOXWR'UToday's Date: 08/31/2014 Reason for consult: Initial assessment  Visited with Mom, baby 813 hrs old.  Mom has baby skin to skin on her chest.  Reassured her that 4 feedings in first 13 hrs is very good, and to expect baby to be somewhat sleepy at times.  Encouraged her to exclusively breast feed, and explained the benefits with this.  Discouraged formula, bottle, and pacifier use for 4-6 weeks.  Recommended skin to skin, and cue based feedings.  Brochure left with Mom.  Explained about the IP and OP lactation services available to her.  Encouraged her to call for assistance prn, and follow up in am. Consult Status Consult Status: Follow-up Date: 09/01/14 Follow-up type: In-patient    Judee ClaraSmith, Nasrin Lanzo E 08/31/2014, 3:08 PM

## 2014-08-31 NOTE — H&P (Signed)
[name] is a 39 y.o. female presenting for labor. Maternal Medical History:  Contractions: Perceived severity is moderate.    Fetal activity: Perceived fetal activity is normal.   Last perceived fetal movement was within the past hour.    Prenatal complications: no prenatal complications Prenatal Complications - Diabetes: none.    OB History    Gravida Para Term Preterm AB TAB SAB Ectopic Multiple Living   7 3 2  0 2 1 1  0 1 2     Past Medical History  Diagnosis Date  . No pertinent past medical history   . Postpartum care and examination of lactating mother 11/20/2010   Past Surgical History  Procedure Laterality Date  . Hemorrhoid surgery      after delivery  . Cervical cerclage    . Cervical cerclage N/A 02/21/2014    Procedure: Modified McDonald CERVICAL CERCLAGE ;  Surgeon: Genia DelMarie-Lyne Lavoie, MD;  Location: [facility];  Service: Gynecology;  Laterality: N/A;  EDD: 09/05/14   Family History: family history is not on file. Social History:  reports that she has never smoked. She has never used smokeless tobacco. She reports that she does not drink alcohol or use illicit drugs.   Prenatal Transfer Tool  Maternal Diabetes: No Genetic Screening: Normal Maternal Ultrasounds/Referrals: Normal Fetal Ultrasounds or other Referrals:  None Maternal Substance Abuse:  No Significant Maternal Medications:  Meds include: Other:  had Makena and BMZ Significant Maternal Lab Results:  None Other Comments:  None  Review of Systems  Constitutional: Negative.   All other systems reviewed and are negative.   Dilation: 10 Effacement (%): 100 Station: +1, +2 Exam by:: L.Stubbs, RN Blood pressure 147/92, pulse 71, temperature 97.8 F (36.6 C), temperature source Oral, resp. rate 20, height 5' 4.96" (1.65 m), weight 95.255 kg (210 lb), last menstrual period 03/16/2013, unknown if currently breastfeeding. Maternal Exam:  Uterine Assessment: Contraction strength is moderate.   Contraction frequency is irregular.   Abdomen: Patient reports no abdominal tenderness. Fetal presentation: vertex  Introitus: Normal vulva. Normal vagina.  Pelvis: adequate for delivery.   Cervix: Cervix evaluated by digital exam.     Physical Exam  Nursing note and vitals reviewed. Constitutional: She appears well-developed and well-nourished.  Cardiovascular: Normal rate and regular rhythm.   Respiratory: Effort normal and breath sounds normal.    Prenatal labs: ABO, Rh: --/--/O POS (10/14 1050) Antibody: NEG (10/14 1050) Rubella:   RPR:    HBsAg:    HIV:    GBS:     Assessment/Plan: Active Labor  NSVD   Ibraham Levi J 08/31/2014, 1:29 AM

## 2014-08-31 NOTE — Anesthesia Preprocedure Evaluation (Addendum)
Anesthesia Evaluation  Patient identified by MRN, date of birth, ID band Patient awake and Patient confused    Reviewed: Allergy & Precautions, H&P , NPO status , Patient's Chart, lab work & pertinent test results  Airway Mallampati: II   Neck ROM: full    Dental  (+) Teeth Intact   Pulmonary  breath sounds clear to auscultation  Pulmonary exam normal       Cardiovascular Exercise Tolerance: Good negative cardio ROS  Rhythm:regular Rate:Normal     Neuro/Psych    GI/Hepatic negative GI ROS, Neg liver ROS,   Endo/Other  negative endocrine ROS  Renal/GU      Musculoskeletal   Abdominal Normal abdominal exam  (+)   Peds  Hematology   Anesthesia Other Findings   Reproductive/Obstetrics (+) Pregnancy                            Anesthesia Physical Anesthesia Plan  ASA: II  Anesthesia Plan: Epidural   Post-op Pain Management:    Induction:   Airway Management Planned:   Additional Equipment:   Intra-op Plan:   Post-operative Plan:   Informed Consent: I have reviewed the patients History and Physical, chart, labs and discussed the procedure including the risks, benefits and alternatives for the proposed anesthesia with the patient or authorized representative who has indicated his/her understanding and acceptance.     Plan Discussed with:   Anesthesia Plan Comments:        Anesthesia Quick Evaluation

## 2014-08-31 NOTE — Anesthesia Procedure Notes (Signed)
Epidural Patient location during procedure: OB Start time: 08/31/2014 12:35 AM End time: 08/31/2014 12:50 AM  Staffing Anesthesiologist: Sebastian AcheMANNY, Haroun Cotham Performed by: anesthesiologist   Preanesthetic Checklist Completed: patient identified, site marked, surgical consent, pre-op evaluation, timeout performed, IV checked, risks and benefits discussed and monitors and equipment checked  Epidural Patient position: sitting Prep: site prepped and draped and DuraPrep Patient monitoring: heart rate, continuous pulse ox and blood pressure Approach: midline Location: L3-L4 Injection technique: LOR air  Needle:  Needle type: Tuohy  Needle gauge: 17 G Needle length: 9 cm and 9 Needle insertion depth: 6 cm Catheter type: closed end flexible Catheter size: 20 Guage Catheter at skin depth: 13 cm Test dose: negative  Assessment Events: blood not aspirated, injection not painful, no injection resistance, negative IV test and no paresthesia  Additional Notes   Patient tolerated the insertion well without complications.  Initially tried pt. On side because of her strong labor and 8cm dilation.  Could not find LOR.  Pt. Sat up and LOR found on first pass.Reason for block:procedure for pain

## 2014-08-31 NOTE — Anesthesia Postprocedure Evaluation (Signed)
  Anesthesia Post-op Note  Patient: [name]  Procedure(s) Performed: * No procedures listed *  Patient Location: Mother/Baby  Anesthesia Type:Epidural  Level of Consciousness: awake and alert   Airway and Oxygen Therapy: Patient Spontanous Breathing  Post-op Pain: mild  Post-op Assessment: Post-op Vital signs reviewed, Patient's Cardiovascular Status Stable, Respiratory Function Stable, No signs of Nausea or vomiting, Pain level controlled, No headache, No residual numbness and No residual motor weakness  Post-op Vital Signs: Reviewed  Last Vitals:  Filed Vitals:   08/31/14 0435  BP: 141/81  Pulse: 70  Temp: 36.9 C  Resp: 18    Complications: No apparent anesthesia complications

## 2014-08-31 NOTE — Progress Notes (Signed)
Patient ID: [name], female   DOB: 09/25/1975, 39 y.o.   MRN: [id number] INTERVAL NOTE:  S:   Sitting in bed eating breakfast with baby in the bed with her, min cramping, (+) voids, small bleed, denies HA/NV/dizziness  BTL got cancelled today - unsure if will be able to do have BTL in 6 wks d/t childcare issues  O:   VSS, AAO x 3, NAD  U-even  Small lochia with small clots / no increased bleeding or clots with fundal massage  A / P:   PPD #0  Stable post partum  Routine PP orders  Planning circumcision  Kenard GowerAWSON, [name], M, MSN, CNM 08/31/2014, 10:00 AM

## 2014-08-31 NOTE — Progress Notes (Signed)
Message received from Cresenciano GenreE. Branstrator,RN, who spoke with OR staff. Patient's post partum tubal ligation cannot be done today. Epidural cath to be pulled and patient may eat.

## 2014-09-01 MED ORDER — LIDOCAINE 1%/NA BICARB 0.1 MEQ INJECTION
0.8000 mL | INJECTION | Freq: Once | INTRAVENOUS | Status: DC
  Filled 2014-09-01: qty 1

## 2014-09-01 MED ORDER — IBUPROFEN 600 MG PO TABS: 600.0000 mg | ORAL_TABLET | Freq: Four times a day (QID) | ORAL | Status: DC

## 2014-09-01 MED ORDER — ACETAMINOPHEN FOR CIRCUMCISION 160 MG/5 ML
40.0000 mg | Freq: Once | ORAL | Status: DC
  Filled 2014-09-01: qty 2.5

## 2014-09-01 MED ORDER — ACETAMINOPHEN FOR CIRCUMCISION 160 MG/5 ML
40.0000 mg | ORAL | Status: DC | PRN
  Filled 2014-09-01: qty 2.5

## 2014-09-01 MED ORDER — EPINEPHRINE TOPICAL FOR CIRCUMCISION 0.1 MG/ML
1.0000 [drp] | TOPICAL | Status: DC | PRN
  Filled 2014-09-01: qty 0.05

## 2014-09-01 MED ORDER — SUCROSE 24% NICU/PEDS ORAL SOLUTION
0.5000 mL | OROMUCOSAL | Status: DC | PRN
  Filled 2014-09-01: qty 0.5

## 2014-09-01 NOTE — Progress Notes (Signed)
PPD #1- SVD  Subjective:   Reports feeling well, desires early discharge Tolerating po/ No nausea or vomiting Bleeding is light Pain controlled with Motrin Up ad lib / ambulatory / voiding without problems Newborn: breastfeeding  / Circumcision: not planning   Objective:   VS: VS:  Filed Vitals:   08/31/14 0435 08/31/14 0800 08/31/14 1802 09/01/14 0610  BP: 141/81 131/76 128/82 117/67  Pulse: 70 68 74 72  Temp: 98.4 F (36.9 C) 98.5 F (36.9 C) 98.3 F (36.8 C) 98.2 F (36.8 C)  TempSrc: Oral Oral Oral Oral  Resp: 18 18 18 18   Height:      Weight:      SpO2:  99%  98%    LABS:  Recent Labs  08/31/14 0010 08/31/14 0556  WBC 7.6 12.6*  HGB 13.0 12.1  PLT 230 205   Blood type: --/--/O POS (10/14 1050) Rubella: Immune (09/22 0000)                I&O: Intake/Output      04/22 0701 - 04/23 0700 04/23 0701 - 04/24 0700   Blood     Total Output       Net              Physical Exam: Alert and oriented X3 Abdomen: soft, non-tender, non-distended  Fundus: firm, non-tender, U-2 Perineum: Well approximated, no significant erythema, edema, or drainage; healing well. Lochia: small Extremities: no edema, no calf pain or tenderness    Assessment: PPD #1  G7P3023/ S/P:spontaneous vaginal, 2nd degree laceration Doing well - stable for discharge home   Plan: Discharge home RX's:  Ibuprofen 600mg  po Q 6 hrs prn pain #30 Refill x 0 Routine pp visit in [employer] [facility] booklet given Plan BTL outpt in 6 wks    [name], N MSN, CNM 09/01/2014, 11:58 AM

## 2014-09-01 NOTE — Discharge Summary (Signed)
Obstetric Discharge Summary Reason for Admission: onset of labor Prenatal Procedures: ultrasound and and uncomplicated Intrapartum Procedures: spontaneous vaginal delivery and epidural Postpartum Procedures: none Complications-Operative and Postpartum: 2nd degree perineal laceration HEMOGLOBIN  Date Value Ref Range Status  08/31/2014 12.1 12.0 - 15.0 g/dL Final   HCT  Date Value Ref Range Status  08/31/2014 35.8* 36.0 - 46.0 % Final    Physical Exam:  General: alert and cooperative Lochia: appropriate Uterine Fundus: firm Incision: healing well, no significant drainage, no dehiscence, no significant erythema DVT Evaluation: No evidence of DVT seen on physical exam. Negative Homan's sign. No cords or calf tenderness. No significant calf/ankle edema.  Discharge Diagnoses: Term Pregnancy-delivered  Discharge Information: Date: 09/01/2014 Activity: pelvic rest Diet: routine Medications: PNV and Ibuprofen Condition: stable Instructions: refer to practice specific booklet Discharge to: home Follow-up Information    Follow up with LAVOIE,MARIE-LYNE, MD. Schedule an appointment as soon as possible for a visit in 6 weeks.   Specialty:  Obstetrics and Gynecology   Contact information:   [address] Severe[address] [place] [address] [phone]       Newborn Data: Live born female on 08/31/14 Birth Weight: 7 lb 8.8 oz (3425 g) APGAR: 8, 9  Home with mother.  Oshay Stranahan, N 09/01/2014, 12:03 PM

## 2014-09-02 NOTE — Clinical Social Work Maternal (Signed)
  CLINICAL SOCIAL WORK MATERNAL/CHILD NOTE  Patient Details  Name: [name] MRN: [id number] Date of Birth: 1976/01/11  Date:  09/02/2014  Clinical Social Worker Initiating Note:  Norlene Duel, LCSW Date/ Time Initiated:  09/01/14/1300     Child's Name:  Wardell Honour   Legal Guardian:   (Parents Malaika Florian-Ramirez and Erskine Squibb)   Need for Interpreter:  None   Date of Referral:  09/01/14     Reason for Referral:   (Partner relationship issues)   Referral Source:  [employer]   Address:  [address]  [place], [place] [address]  Phone number:   [phone])   Household Members:  Parents, Minor Children   Natural Supports (not living in the home):  Chief Executive Officer Supports: Therapist   Employment: Full-time   Type of Work:  (Mother works in a Banker and FOB has a Ambulance person)   Education:   (n/a)   Museum/gallery curator Resources:  Multimedia programmer   Other Resources:      Cultural/Religious Considerations Which May Impact Care:  none identified  Strengths:  Ability to meet basic needs , Home prepared for child    Risk Factors/Current Problems:   Higher risk for PP Depression  Cognitive State:  Alert , Insightful    Mood/Affect:  Tearful , Sad    CSW Assessment:  Acknowledged order for social work consult.  Per chart, parents separated about a year ago and mother's affect was flat.     Met with mother who was pleasant and receptive to social work.  She was tearful upon arrival and continued to be emotional throughout the assessment.   Parents are married and have 2 other dependents ages 57 and 65.    Mother reports that she and spouse continue to share their home but there is no intimacy because he is involved in another relationship.  She spoke at length about the affect it has had on her and her children.  She is willing to work on the marriage, and although her husband says he wants to remain in the marriage, his behavior  contrary.   She was very transparent about her fears and disappointments.   She demonstrated good insight and awareness of the tough choices she is now faced with and the difficulties she will experience moving forward.  She is currently working with a therapist and her next appointment less than three weeks away.  Discussed the importance of continuing to work with a therapist.  Validated her feelings, provided supportive feedback, and encouraged her to continue working through her issues in therapy.    Discussed her increase risk for PP Depression and need to be aware of the signs and symptoms.   She seemed grateful for the opportunity to vent.  She denies SI, HI and there was no evidence of psychosis.  Encouraged her to try to place less emphasis on her husband and focus more on her recovery and caring for her children.  Mother informed of social work Fish farm manager.   CSW Plan/Description:  No Further Intervention Required/No Barriers to Discharge    Shahara Hartsfield J, LCSW 09/02/2014, 9:35 AM

## 2014-09-07 ENCOUNTER — Encounter (HOSPITAL_COMMUNITY): Payer: Self-pay | Admitting: General Practice

## 2014-09-09 NOTE — Op Note (Signed)
Patient notified of time change for case Sunday 09/09/14 at 4 pm, via voicemail. Dr Seymour BarsLavoie request.

## 2014-09-10 ENCOUNTER — Other Ambulatory Visit: Payer: Self-pay | Admitting: Obstetrics & Gynecology

## 2014-09-11 ENCOUNTER — Ambulatory Visit (HOSPITAL_COMMUNITY): Payer: Commercial Managed Care - PPO | Admitting: Anesthesiology

## 2014-09-11 ENCOUNTER — Encounter (HOSPITAL_COMMUNITY)
Admission: RE | Disposition: A | Discharge status: Home / Self Care | Payer: Self-pay | Source: Ambulatory Visit | Attending: Obstetrics & Gynecology

## 2014-09-11 ENCOUNTER — Encounter (HOSPITAL_COMMUNITY): Payer: Self-pay

## 2014-09-11 ENCOUNTER — Ambulatory Visit (HOSPITAL_COMMUNITY)
Admission: RE | Admit: 2014-09-11 | Discharge: 2014-09-11 | Disposition: A | Discharge status: Home / Self Care | Payer: Commercial Managed Care - PPO | Source: Ambulatory Visit | Attending: Obstetrics & Gynecology | Admitting: Obstetrics & Gynecology

## 2014-09-11 DIAGNOSIS — Z302 Encounter for sterilization: Secondary | ICD-10-CM | POA: Diagnosis not present

## 2014-09-11 HISTORY — PX: LAPAROSCOPIC TUBAL LIGATION: SHX1937

## 2014-09-11 LAB — CBC
HCT: 39.6 % (ref 36.0–46.0)
Hemoglobin: 13.4 g/dL (ref 12.0–15.0)
MCH: 29.3 pg (ref 26.0–34.0)
MCHC: 33.8 g/dL (ref 30.0–36.0)
MCV: 86.7 fL (ref 78.0–100.0)
PLATELETS: 347 10*3/uL (ref 150–400)
RBC: 4.57 MIL/uL (ref 3.87–5.11)
RDW: 14.4 % (ref 11.5–15.5)
WBC: 7.5 10*3/uL (ref 4.0–10.5)

## 2014-09-11 SURGERY — LIGATION, FALLOPIAN TUBE, LAPAROSCOPIC
Anesthesia: General | Site: Abdomen | Laterality: Bilateral

## 2014-09-11 MED ORDER — HYDROCODONE-ACETAMINOPHEN 5-325 MG PO TABS
ORAL_TABLET | ORAL | Status: AC
  Filled 2014-09-11: qty 1

## 2014-09-11 MED ORDER — KETOROLAC TROMETHAMINE 30 MG/ML IJ SOLN
INTRAMUSCULAR | Status: AC
  Filled 2014-09-11: qty 1

## 2014-09-11 MED ORDER — NEOSTIGMINE METHYLSULFATE 10 MG/10ML IV SOLN
INTRAVENOUS | Status: DC | PRN
  Administered 2014-09-11: 2 mg via INTRAVENOUS

## 2014-09-11 MED ORDER — GLYCOPYRROLATE 0.2 MG/ML IJ SOLN
INTRAMUSCULAR | Status: DC | PRN
  Administered 2014-09-11: 0.4 mg via INTRAVENOUS

## 2014-09-11 MED ORDER — FENTANYL CITRATE (PF) 100 MCG/2ML IJ SOLN
INTRAMUSCULAR | Status: DC | PRN
  Administered 2014-09-11: 100 ug via INTRAVENOUS

## 2014-09-11 MED ORDER — FENTANYL CITRATE (PF) 100 MCG/2ML IJ SOLN: 25.0000 ug | INTRAMUSCULAR | Status: DC | PRN

## 2014-09-11 MED ORDER — LACTATED RINGERS IV SOLN
INTRAVENOUS | Status: DC
  Administered 2014-09-11: 14:00:00 via INTRAVENOUS
  Administered 2014-09-11: 10 mL/h via INTRAVENOUS

## 2014-09-11 MED ORDER — ROCURONIUM BROMIDE 100 MG/10ML IV SOLN
INTRAVENOUS | Status: AC
  Filled 2014-09-11: qty 1

## 2014-09-11 MED ORDER — PROPOFOL 10 MG/ML IV BOLUS
INTRAVENOUS | Status: AC
  Filled 2014-09-11: qty 20

## 2014-09-11 MED ORDER — NEOSTIGMINE METHYLSULFATE 10 MG/10ML IV SOLN
INTRAVENOUS | Status: AC
  Filled 2014-09-11: qty 1

## 2014-09-11 MED ORDER — CEFAZOLIN SODIUM-DEXTROSE 2-3 GM-% IV SOLR
INTRAVENOUS | Status: AC
  Filled 2014-09-11: qty 50

## 2014-09-11 MED ORDER — BUPIVACAINE HCL (PF) 0.25 % IJ SOLN
INTRAMUSCULAR | Status: DC | PRN
  Administered 2014-09-11: 10 mL

## 2014-09-11 MED ORDER — METOCLOPRAMIDE HCL 5 MG/ML IJ SOLN
INTRAMUSCULAR | Status: DC | PRN
  Administered 2014-09-11: 10 mg via INTRAVENOUS

## 2014-09-11 MED ORDER — DEXAMETHASONE SODIUM PHOSPHATE 4 MG/ML IJ SOLN
INTRAMUSCULAR | Status: AC
  Filled 2014-09-11: qty 1

## 2014-09-11 MED ORDER — PROPOFOL 10 MG/ML IV BOLUS
INTRAVENOUS | Status: DC | PRN
  Administered 2014-09-11: 160 mg via INTRAVENOUS

## 2014-09-11 MED ORDER — FENTANYL CITRATE (PF) 250 MCG/5ML IJ SOLN
INTRAMUSCULAR | Status: AC
  Filled 2014-09-11: qty 5

## 2014-09-11 MED ORDER — KETOROLAC TROMETHAMINE 30 MG/ML IJ SOLN
INTRAMUSCULAR | Status: DC | PRN
  Administered 2014-09-11: 30 mg via INTRAVENOUS

## 2014-09-11 MED ORDER — SCOPOLAMINE 1 MG/3DAYS TD PT72
MEDICATED_PATCH | TRANSDERMAL | Status: AC
  Administered 2014-09-11: 1.5 mg via TRANSDERMAL
  Filled 2014-09-11: qty 1

## 2014-09-11 MED ORDER — MIDAZOLAM HCL 2 MG/2ML IJ SOLN
INTRAMUSCULAR | Status: DC | PRN
  Administered 2014-09-11: 2 mg via INTRAVENOUS

## 2014-09-11 MED ORDER — ROCURONIUM BROMIDE 100 MG/10ML IV SOLN
INTRAVENOUS | Status: DC | PRN
  Administered 2014-09-11: 20 mg via INTRAVENOUS

## 2014-09-11 MED ORDER — BUPIVACAINE HCL (PF) 0.25 % IJ SOLN
INTRAMUSCULAR | Status: AC
  Filled 2014-09-11: qty 10

## 2014-09-11 MED ORDER — HYDROCODONE-ACETAMINOPHEN 5-325 MG PO TABS
1.0000 | ORAL_TABLET | Freq: Once | ORAL | Status: AC
  Administered 2014-09-11: 1 via ORAL

## 2014-09-11 MED ORDER — MIDAZOLAM HCL 2 MG/2ML IJ SOLN
INTRAMUSCULAR | Status: AC
  Filled 2014-09-11: qty 2

## 2014-09-11 MED ORDER — LIDOCAINE HCL (CARDIAC) 20 MG/ML IV SOLN
INTRAVENOUS | Status: AC
  Filled 2014-09-11: qty 5

## 2014-09-11 MED ORDER — SUCCINYLCHOLINE CHLORIDE 20 MG/ML IJ SOLN
INTRAMUSCULAR | Status: AC
  Filled 2014-09-11: qty 20

## 2014-09-11 MED ORDER — CEFAZOLIN SODIUM-DEXTROSE 2-3 GM-% IV SOLR
2.0000 g | INTRAVENOUS | Status: AC
  Administered 2014-09-11: 2 g via INTRAVENOUS

## 2014-09-11 MED ORDER — HYDROCODONE-IBUPROFEN 5-200 MG PO TABS: 1.0000 | ORAL_TABLET | Freq: Three times a day (TID) | ORAL | Status: DC | PRN

## 2014-09-11 MED ORDER — GLYCOPYRROLATE 0.2 MG/ML IJ SOLN
INTRAMUSCULAR | Status: AC
  Filled 2014-09-11: qty 3

## 2014-09-11 MED ORDER — ONDANSETRON HCL 4 MG/2ML IJ SOLN
INTRAMUSCULAR | Status: DC | PRN
  Administered 2014-09-11: 4 mg via INTRAVENOUS

## 2014-09-11 MED ORDER — SCOPOLAMINE 1 MG/3DAYS TD PT72
1.0000 | MEDICATED_PATCH | Freq: Once | TRANSDERMAL | Status: DC
  Administered 2014-09-11: 1.5 mg via TRANSDERMAL

## 2014-09-11 MED ORDER — LIDOCAINE HCL (CARDIAC) 20 MG/ML IV SOLN
INTRAVENOUS | Status: DC | PRN
  Administered 2014-09-11: 100 mg via INTRAVENOUS

## 2014-09-11 MED ORDER — ONDANSETRON HCL 4 MG/2ML IJ SOLN
INTRAMUSCULAR | Status: AC
  Filled 2014-09-11: qty 2

## 2014-09-11 MED ORDER — SUCCINYLCHOLINE CHLORIDE 20 MG/ML IJ SOLN
INTRAMUSCULAR | Status: DC | PRN
  Administered 2014-09-11: 140 mg via INTRAVENOUS

## 2014-09-11 MED ORDER — DEXAMETHASONE SODIUM PHOSPHATE 10 MG/ML IJ SOLN
INTRAMUSCULAR | Status: DC | PRN
  Administered 2014-09-11: 4 mg via INTRAVENOUS

## 2014-09-11 MED ORDER — ONDANSETRON HCL 4 MG/2ML IJ SOLN: 4.0000 mg | Freq: Once | INTRAMUSCULAR | Status: DC | PRN

## 2014-09-11 SURGICAL SUPPLY — 23 items
APPLICATOR COTTON TIP 6IN STRL (MISCELLANEOUS) IMPLANT
CATH ROBINSON RED A/P 16FR (CATHETERS) ×3 IMPLANT
CLOTH BEACON ORANGE TIMEOUT ST (SAFETY) ×3 IMPLANT
DRSG COVADERM PLUS 2X2 (GAUZE/BANDAGES/DRESSINGS) ×6 IMPLANT
DRSG OPSITE POSTOP 3X4 (GAUZE/BANDAGES/DRESSINGS) ×3 IMPLANT
ELECT REM PT RETURN 9FT ADLT (ELECTROSURGICAL) ×3
ELECTRODE REM PT RTRN 9FT ADLT (ELECTROSURGICAL) ×1 IMPLANT
GLOVE BIO SURGEON STRL SZ 6.5 (GLOVE) ×2 IMPLANT
GLOVE BIO SURGEONS STRL SZ 6.5 (GLOVE) ×1
GLOVE BIOGEL PI IND STRL 7.0 (GLOVE) ×2 IMPLANT
GLOVE BIOGEL PI INDICATOR 7.0 (GLOVE) ×4
GOWN STRL REUS W/TWL LRG LVL3 (GOWN DISPOSABLE) ×6 IMPLANT
LIQUID BAND (GAUZE/BANDAGES/DRESSINGS) ×3 IMPLANT
PACK LAPAROSCOPY BASIN (CUSTOM PROCEDURE TRAY) ×3 IMPLANT
PAD OB MATERNITY 4.3X12.25 (PERSONAL CARE ITEMS) ×3 IMPLANT
PAD POSITIONER PINK NONSTERILE (MISCELLANEOUS) ×3 IMPLANT
PENCIL BUTTON HOLSTER BLD 10FT (ELECTRODE) ×3 IMPLANT
SUT MNCRL AB 4-0 PS2 18 (SUTURE) ×3 IMPLANT
SUT VICRYL 0 UR6 27IN ABS (SUTURE) ×3 IMPLANT
TOWEL OR 17X24 6PK STRL BLUE (TOWEL DISPOSABLE) ×6 IMPLANT
TROCAR BALLN 12MMX100 BLUNT (TROCAR) ×3 IMPLANT
WARMER LAPAROSCOPE (MISCELLANEOUS) ×3 IMPLANT
WATER STERILE IRR 1000ML POUR (IV SOLUTION) ×3 IMPLANT

## 2014-09-11 NOTE — Anesthesia Procedure Notes (Signed)
Procedure Name: Intubation Date/Time: 09/11/2014 2:03 PM Performed by: Elgie CongoMALINOVA, Deona Novitski H Pre-anesthesia Checklist: Patient identified, Emergency Drugs available, Suction available and Patient being monitored Patient Re-evaluated:Patient Re-evaluated prior to inductionOxygen Delivery Method: Circle system utilized Preoxygenation: Pre-oxygenation with 100% oxygen Intubation Type: IV induction and Cricoid Pressure applied Ventilation: Mask ventilation without difficulty Laryngoscope Size: Mac and 3 Grade View: Grade I Tube type: Oral Number of attempts: 1 Airway Equipment and Method: Stylet Placement Confirmation: ETT inserted through vocal cords under direct vision,  positive ETCO2 and breath sounds checked- equal and bilateral Secured at: 20 cm Tube secured with: Tape Dental Injury: Teeth and Oropharynx as per pre-operative assessment

## 2014-09-11 NOTE — Addendum Note (Signed)
Addendum  created 09/11/14 1639 by Orlie Pollenebra R Kentaro Alewine, CRNA   Modules edited: Anesthesia Medication Administration

## 2014-09-11 NOTE — Discharge Summary (Signed)
  Physician Discharge Summary  Patient ID: [name] MRN: [id number] DOB/AGE: 38/09/1975 37 y.o.  Admit date: 09/11/2014 Discharge date: 09/11/2014  Admission Diagnoses: Desires Sterilization  Discharge Diagnoses: Desires Sterilization        Active Problems:   * No active hospital problems. *   Discharged Condition: good  Hospital Course: Outpatient  Consults: None  Treatments: surgery: Laparoscopic Bilateral Tubal Sterilization by Cauterization  Disposition: 01-Home or Self Care     Medication List    TAKE these medications        hydrocodone-ibuprofen 5-200 MG per tablet  Commonly known as:  VICOPROFEN  Take 1 tablet by mouth every 8 (eight) hours as needed for pain.     ibuprofen 600 MG tablet  Commonly known as:  ADVIL,MOTRIN  Take 1 tablet (600 mg total) by mouth every 6 (six) hours.     prenatal multivitamin Tabs tablet  Take 1 tablet by mouth daily at 12 noon.           Follow-up Information    Follow up with Kajuan Guyton,MARIE-LYNE, MD In 5 weeks.   Specialty:  Obstetrics and Gynecology   Contact information:   [address] [place] [address] [phone]       Signed: Genia DelLAVOIE,MARIE-LYNE, MD 09/11/2014, 2:57 PM

## 2014-09-11 NOTE — Op Note (Signed)
09/11/2014  2:46 PM  PATIENT:  [name]  39 y.o. female  PRE-OPERATIVE DIAGNOSIS:  Desires Sterilization  POST-OPERATIVE DIAGNOSIS:  Desires sterilization  PROCEDURE:  Procedure(s): LAPAROSCOPIC TUBAL STERILIZATION With Cautery  SURGEON:  Surgeon(s): Genia DelMarie-Lyne Latressa Harries, MD  ASSISTANTS: none   ANESTHESIA:   general   PROCEDURE:  Under general anesthesia with endotracheal intubation the patient is in lithotomy position. She was prepped with ChloraPrep on the abdomen and with Betadine on the suprapubic, vulvar and vaginal areas.  She is draped as usual. The speculum is inserted in the vagina and the uterus is cannulated with the 1 tooth tenaculum on the anterior lip of the cervix.  The speculum is removed. The bladder was catheterized.  The supraumbilical area is infiltrated with Marcaine one quarter plain 10 cc.  The aponeurosis is opened under direct vision with Mayo scissors.  The parietal peritoneum is opened bluntly with the finger.  A pursestring stitch of Vicryl 0 was done on the aponeurosis.  The Roseanne RenoHassan is inserted at that level. A pneumoperitoneum is created with CO2.  The abdominal pelvic cavities are inspected and no pathology is seen.  Pictures are taken of the liver, the appendix, both tubes, both ovaries and the uterus.  The operative scope was used allowing introduction of the bipolar Kleppinger at that level.  The right tube is cauterized at about 2 cm of the cornea and all its width to the mesosalpinx, then cauterized distally from there and finally cauterized in the middle.  The left tube is cauterized in the same fashion.  The fimbria of each tube were well identified.  Hemostasis was adequate at all levels.  Pictures were taken after cauterization of the tubes.  The instrument was removed.  The [place]assan port was removed. The CO2 was evacuated.  The pursestring stitch was attached closing the aponeurosis very well.  The skin was closed with a subcuticular stitch of  Vicryl 4-0. Dermabond was added and a honeycomb dressing.  The vaginal instrument was removed.  The patient was brought to recovery room in good and stable status.    ESTIMATED BLOOD LOSS:  5 cc   Intake/Output Summary (Last 24 hours) at 09/11/14 1446 Last data filed at 09/11/14 1440  Gross per 24 hour  Intake   1000 ml  Output    205 ml  Net    795 ml     BLOOD ADMINISTERED:none   LOCAL MEDICATIONS USED:  MARCAINE     SPECIMEN:  No Specimen  DISPOSITION OF SPECIMEN:  N/A  COUNTS:  YES  PLAN OF CARE: Transfer to PACU  Genia DelMarie-Lyne Cebert Dettmann MD  09/11/2014 at 2:46 pm

## 2014-09-11 NOTE — Anesthesia Preprocedure Evaluation (Addendum)
Anesthesia Evaluation  Patient identified by MRN, date of birth, ID band Patient awake    Reviewed: Allergy & Precautions, NPO status , Patient's Chart, lab work & pertinent test results  History of Anesthesia Complications Negative for: history of anesthetic complications  Airway Mallampati: II   Neck ROM: Full    Dental  (+) Dental Advisory Given, Teeth Intact   Pulmonary neg pulmonary ROS,  breath sounds clear to auscultation        Cardiovascular negative cardio ROS  Rhythm:Regular     Neuro/Psych negative neurological ROS  negative psych ROS   GI/Hepatic negative GI ROS, Neg liver ROS,   Endo/Other  negative endocrine ROS  Renal/GU negative Renal ROS  negative genitourinary   Musculoskeletal negative musculoskeletal ROS (+)   Abdominal (+)  Abdomen: soft.    Peds negative pediatric ROS (+)  Hematology negative hematology ROS (+)   Anesthesia Other Findings   Reproductive/Obstetrics negative OB ROS                            Anesthesia Physical Anesthesia Plan  ASA: II  Anesthesia Plan: General   Post-op Pain Management:    Induction: Intravenous  Airway Management Planned: Oral ETT  Additional Equipment:   Intra-op Plan:   Post-operative Plan: Extubation in OR  Informed Consent: I have reviewed the patients History and Physical, chart, labs and discussed the procedure including the risks, benefits and alternatives for the proposed anesthesia with the patient or authorized representative who has indicated his/her understanding and acceptance.   Dental advisory given  Plan Discussed with: CRNA  Anesthesia Plan Comments:         Anesthesia Quick Evaluation

## 2014-09-11 NOTE — Transfer of Care (Signed)
Immediate Anesthesia Transfer of Care Note  Patient: [name]  Procedure(s) Performed: Procedure(s): LAPAROSCOPIC TUBAL LIGATION With Cautery (Bilateral)  Patient Location: PACU  Anesthesia Type:General  Level of Consciousness: awake, alert  and oriented  Airway & Oxygen Therapy: Patient Spontanous Breathing and Patient connected to nasal cannula oxygen  Post-op Assessment: Report given to RN, Post -op Vital signs reviewed and stable and Patient moving all extremities  Post vital signs: Reviewed and stable  Last Vitals:  Filed Vitals:   09/11/14 1241  BP: 126/90  Pulse: 75  Temp: 36.7 C  Resp: 18    Complications: No apparent anesthesia complications

## 2014-09-11 NOTE — Anesthesia Postprocedure Evaluation (Signed)
  Anesthesia Post-op Note  Patient: [name]  Procedure(s) Performed: Procedure(s): LAPAROSCOPIC TUBAL LIGATION With Cautery (Bilateral)  Patient Location: PACU  Anesthesia Type:General  Level of Consciousness: awake and alert   Airway and Oxygen Therapy: Patient Spontanous Breathing and Patient connected to nasal cannula oxygen  Post-op Pain: mild  Post-op Assessment: Post-op Vital signs reviewed, Patient's Cardiovascular Status Stable, Respiratory Function Stable and Patent Airway  Post-op Vital Signs: Reviewed and stable  Last Vitals:  Filed Vitals:   09/11/14 1451  BP: 122/75  Pulse: 62  Temp: 36.6 C  Resp: 16    Complications: No apparent anesthesia complications

## 2014-09-11 NOTE — Discharge Instructions (Signed)
Ligadura de trompas con laparoscopa - Cuidados posteriores  (Laparoscopic Tubal Ligation, Care After) Siga estas instrucciones durante las prximas semanas. Estas indicaciones le proporcionan informacin general acerca de cmo deber cuidarse despus del procedimiento. El mdico tambin podr darle instrucciones ms especficas. El tratamiento ha sido planificado segn las prcticas mdicas actuales, pero en algunos casos pueden ocurrir problemas. Comunquese con el mdico si tiene algn problema o tiene preguntas despus del procedimiento.  INSTRUCCIONES PARA EL CUIDADO EN EL HOGAR   Haga reposo por el resto del da.  Slo tome medicamentos de venta libre o recetados para Primary school teachercalmar el dolor, las molestias o bajar la fiebre segn las indicaciones de su mdico. No tome aspirina. Puede ocasionar hemorragias.  Podr reanudar gradualmente sus actividades diarias, la dieta, el descanso, conducir vehculos y Printmakertrabajar.  Evite las actividades extenuantes durante 2 semanas, o segn lo que le hayan indicado.  No use tampones ni se haga duchas vaginales.  No conduzca automviles luego de tomar analgsicos.  No levante objetos que pesen ms de 5 libras (2,5 kg) durante 2 semanas, o segn las indicaciones.  Solo tome duchas, evite los baos, hasta que el mdico vuelva a verla.  Cambie el vendaje tal como se le indic.  Tmese la Chubb Corporationtemperatura dos veces por da y Engineering geologistregstrela.  Trate de conseguir ayuda para las tareas [address]domsticas durante 7 a [address] das.  Concurra a una visita con el mdico para que Hewlett-Packardle retires los puntos (suturas) y para Aeronautical engineerun seguimiento. SOLICITE ATENCIN MDICA SI:   Presenta enrojecimiento, hinchazn o aumento del dolor en la herida.  Hay una secrecin en la herida que dura ms de Civil engineer, contractingun da.  El dolor Farm[place]empeora.  Tiene una erupcin.  Se siente mareada o sufre un desmayo.  Tiene algn problema o sufre alguna reaccin a los medicamentos.  Necesita un medicamento ms fuerte o un cambio de  Microbiologistanalgsico.  Advierte un olor feo que proviene de la herida o del vendaje.  La herida se abre luego de que le han extrado las suturas.  Est constipada. SOLICITE ATENCIN MDICA DE INMEDIATO SI:   Se desmaya.  Tiene fiebre.  Siente cada vez ms dolor abdominal.  Siente dolor intenso en los hombros.  Tiene un sangrado o drenaje en la sutura, o que proviene de la vagina (canal del parto) luego de la Azerbaijanciruga.  Le falta el aire o tiene dificultad para respirar.  Siente dolor en el pecho o en las piernas.  Tiene nuseas o vmitos persistentes. ASEGRESE DE QUE:   Comprende estas instrucciones.  Controlar su enfermedad.  Solicitar ayuda de inmediato si no mejora o si empeora. Document Released: 10/14/2007 Document Revised: 01/20/2012 [facility] Patient Information 2015 [place], [place]. This information is not intended to replace advice given to you by your health care provider. Make sure you discuss any questions you have with your health care provider.

## 2014-09-11 NOTE — H&P (Signed)
[name] [name] is an 39 y.o. female [phone]  RP:  Sterilization  Pertinent Gynecological History: Menses: PP normal lochia. Contraception: abstinence PP Blood transfusions: none Sexually transmitted diseases: no past history Last pap: normal OB History: [id number]   Menstrual History:  Patient's last menstrual period was 03/16/2013.    Past Medical History  Diagnosis Date  . No pertinent past medical history   . Postpartum care and examination of lactating mother 11/20/2010  . Postpartum care following vaginal delivery (4/22) 08/31/2014    Past Surgical History  Procedure Laterality Date  . Hemorrhoid surgery      after delivery  . Cervical cerclage    . Cervical cerclage N/A 02/21/2014    Procedure: Modified McDonald CERVICAL CERCLAGE ;  Surgeon: Stacey DelMarie-Lyne Priya Matsen, MD;  Location: [facility];  Service: Gynecology;  Laterality: N/A;  EDD: 09/05/14    History reviewed. No pertinent family history.  Social History:  reports that she has never smoked. She has never used smokeless tobacco. She reports that she does not drink alcohol or use illicit drugs.  Allergies: No Known Allergies  Prescriptions prior to admission  Medication Sig Dispense Refill Last Dose  . Prenatal Vit-Fe Fumarate-FA (PRENATAL MULTIVITAMIN) TABS tablet Take 1 tablet by mouth daily at 12 noon.   09/10/2014 at Unknown time  . ibuprofen (ADVIL,MOTRIN) 600 MG tablet Take 1 tablet (600 mg total) by mouth every 6 (six) hours. (Patient not taking: Reported on 09/06/2014) 30 tablet 0     ROS neg  Blood pressure 126/90, pulse 75, temperature 98.1 F (36.7 [name]), temperature source Oral, resp. rate 18, height 5\' 8"  (1.727 m), weight 189 lb (85.73 kg), last menstrual period 03/16/2013, SpO2 99 %, unknown if currently breastfeeding. Physical Exam  Results for orders placed or performed during the hospital encounter of 09/11/14 (from the past 24 hour(s))  CBC     Status: None   Collection Time: 09/11/14 12:40 PM   Result Value Ref Range   WBC 7.5 4.0 - 10.5 K/uL   RBC 4.57 3.87 - 5.11 MIL/uL   Hemoglobin 13.4 12.0 - 15.0 g/dL   HCT 19.139.6 47.836.0 - 29.546.0 %   MCV 86.7 78.0 - 100.0 fL   MCH 29.3 26.0 - 34.0 pg   MCHC 33.8 30.0 - 36.0 g/dL   RDW 62.114.4 30.811.5 - 65.715.5 %   Platelets 347 150 - 400 K/uL     Assessment/Plan: Desire for Sterilization.  LPS BT/S with cautherization.  Surgery and risks reviewed.  [name],Stacey 09/11/2014, 1:46 PM

## 2014-09-12 ENCOUNTER — Encounter (HOSPITAL_COMMUNITY): Payer: Self-pay | Admitting: Obstetrics & Gynecology

## 2014-12-03 ENCOUNTER — Ambulatory Visit (INDEPENDENT_AMBULATORY_CARE_PROVIDER_SITE_OTHER): Payer: Commercial Managed Care - PPO | Admitting: Physician Assistant

## 2014-12-03 VITALS — BP 112/80 | HR 73 | Temp 98.0°F | Resp 16 | Ht 66.0 in | Wt 200.6 lb

## 2014-12-03 DIAGNOSIS — R197 Diarrhea, unspecified: Secondary | ICD-10-CM

## 2014-12-03 DIAGNOSIS — K529 Noninfective gastroenteritis and colitis, unspecified: Secondary | ICD-10-CM | POA: Diagnosis not present

## 2014-12-03 LAB — POCT CBC
GRANULOCYTE PERCENT: 48.9 % (ref 37–80)
HEMATOCRIT: 34.8 % — AB (ref 37.7–47.9)
Hemoglobin: 11.4 g/dL — AB (ref 12.2–16.2)
Lymph, poc: 1.8 (ref 0.6–3.4)
MCH, POC: 28.9 pg (ref 27–31.2)
MCHC: 32.8 g/dL (ref 31.8–35.4)
MCV: 88 fL (ref 80–97)
MID (cbc): 0.4 (ref 0–0.9)
MPV: 5.9 fL (ref 0–99.8)
PLATELET COUNT, POC: 308 10*3/uL (ref 142–424)
POC Granulocyte: 2.1 (ref 2–6.9)
POC LYMPH %: 42.3 % (ref 10–50)
POC MID %: 8.8 % (ref 0–12)
RBC: 3.96 M/uL — AB (ref 4.04–5.48)
RDW, POC: 14 %
WBC: 4.2 10*3/uL — AB (ref 4.6–10.2)

## 2014-12-03 LAB — POCT URINALYSIS DIPSTICK
Bilirubin, UA: NEGATIVE
Blood, UA: NEGATIVE
Glucose, UA: NEGATIVE
Ketones, UA: NEGATIVE
Leukocytes, UA: NEGATIVE
Nitrite, UA: NEGATIVE
PH UA: 6
PROTEIN UA: NEGATIVE
Spec Grav, UA: <=1.005
UROBILINOGEN UA: 0.2

## 2014-12-03 LAB — COMPREHENSIVE METABOLIC PANEL
ALBUMIN: 4.4 g/dL (ref 3.6–5.1)
ALT: 45 U/L — ABNORMAL HIGH (ref 6–29)
AST: 28 U/L (ref 10–30)
Alkaline Phosphatase: 87 U/L (ref 33–115)
BUN: 8 mg/dL (ref 7–25)
CO2: 24 mmol/L (ref 20–31)
CREATININE: 0.48 mg/dL — AB (ref 0.50–1.10)
Calcium: 8.7 mg/dL (ref 8.6–10.2)
Chloride: 106 mmol/L (ref 98–110)
Glucose, Bld: 83 mg/dL (ref 65–99)
Potassium: 3.7 mmol/L (ref 3.5–5.3)
SODIUM: 141 mmol/L (ref 135–146)
TOTAL PROTEIN: 6.8 g/dL (ref 6.1–8.1)
Total Bilirubin: 0.4 mg/dL (ref 0.2–1.2)

## 2014-12-03 LAB — POCT UA - MICROSCOPIC ONLY
Bacteria, U Microscopic: NEGATIVE
CRYSTALS, UR, HPF, POC: NEGATIVE
Casts, Ur, LPF, POC: NEGATIVE
MUCUS UA: NEGATIVE
WBC, Ur, HPF, POC: NEGATIVE
Yeast, UA: NEGATIVE

## 2014-12-03 NOTE — Patient Instructions (Signed)
Your vitals signs, exam, white blood cell count, and urine sample were all normal and reassuring today.  You were not dehydrated at all so keep up the good work with drinking water.  Most anti diarrheal medications are not recommended while breast feeding so I recommend waiting a couple more days for this to pass. If you're still having diarrhea for 1-2 more days please obtain a stool sample and bring it back so we can test it for infection.   Diarrhea Diarrhea is frequent loose and watery bowel movements. It can cause you to feel weak and dehydrated. Dehydration can cause you to become tired and thirsty, have a dry mouth, and have decreased urination that often is dark yellow. Diarrhea is a sign of another problem, most often an infection that will not last long. In most cases, diarrhea typically lasts 2-3 days. However, it can last longer if it is a sign of something more serious. It is important to treat your diarrhea as directed by your caregiver to lessen or prevent future episodes of diarrhea. CAUSES  Some common causes include:  Gastrointestinal infections caused by viruses, bacteria, or parasites.  Food poisoning or food allergies.  Certain medicines, such as antibiotics, chemotherapy, and laxatives.  Artificial sweeteners and fructose.  Digestive disorders. HOME CARE INSTRUCTIONS  Ensure adequate fluid intake (hydration): Have 1 cup (8 oz) of fluid for each diarrhea episode. Avoid fluids that contain simple sugars or sports drinks, fruit juices, whole milk products, and sodas. Your urine should be clear or pale yellow if you are drinking enough fluids. Hydrate with an oral rehydration solution that you can purchase at pharmacies, retail stores, and online. You can prepare an oral rehydration solution at home by mixing the following ingredients together:   - tsp table salt.   tsp baking soda.   tsp salt substitute containing potassium chloride.  1  tablespoons sugar.  1 L (34  oz) of water.  Certain foods and beverages may increase the speed at which food moves through the gastrointestinal (GI) tract. These foods and beverages should be avoided and include:  Caffeinated and alcoholic beverages.  High-fiber foods, such as raw fruits and vegetables, nuts, seeds, and whole grain breads and cereals.  Foods and beverages sweetened with sugar alcohols, such as xylitol, sorbitol, and mannitol.  Some foods may be well tolerated and may help thicken stool including:  Starchy foods, such as rice, toast, pasta, low-sugar cereal, oatmeal, grits, baked potatoes, crackers, and bagels.  Bananas.  Applesauce.  Add probiotic-rich foods to help increase healthy bacteria in the GI tract, such as yogurt and fermented milk products.  Wash your hands well after each diarrhea episode.  Only take over-the-counter or prescription medicines as directed by your caregiver.  Take a warm bath to relieve any burning or pain from frequent diarrhea episodes. SEEK IMMEDIATE MEDICAL CARE IF:   You are unable to keep fluids down.  You have persistent vomiting.  You have blood in your stool, or your stools are black and tarry.  You do not urinate in 6-8 hours, or there is only a small amount of very dark urine.  You have abdominal pain that increases or localizes.  You have weakness, dizziness, confusion, or light-headedness.  You have a severe headache.  Your diarrhea gets worse or does not get better.  You have a fever or persistent symptoms for more than 2-3 days.  You have a fever and your symptoms suddenly get worse. MAKE SURE YOU:   Understand  these instructions.  Will watch your condition.  Will get help right away if you are not doing well or get worse. Document Released: 04/17/2002 Document Revised: 09/11/2013 Document Reviewed: 01/03/2012 [facility] Patient Information 2015 [place], [place]. This information is not intended to replace advice given to you by your  health care provider. Make sure you discuss any questions you have with your health care provider.

## 2014-12-03 NOTE — Progress Notes (Signed)
Subjective:    Patient ID: [name], female    DOB: 20-Feb-1976, 39 y.o.   MRN: [id number]  Chief Complaint  Patient presents with  . GI Problem    x 4 days  . Diarrhea   Medications, allergies, past medical history, surgical history, family history, social history and problem list reviewed and updated.  HPI  74 yof who is currently breastfeeding presents with 4 days diarrhea.   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 Persistent 4 days, slightly worsening, watery non bloody. Handful episodes per day. Unable to keep any food down, keeping light fluids down ok. Mild nausea no emesis.   Denies fevers, chills, sick contacts, abd pain, dysuria, hematuria, vaginal dc. Denies recent antibiotics.   Review of Systems See HPI     Objective:   Physical Exam  Constitutional: She is oriented to person, place, and time. She appears well-developed and well-nourished.  Non-toxic appearance. She does not have a sickly appearance. She does not appear ill. No distress.  BP 112/80 mmHg  Pulse 73  Temp(Src) 98 F (36.7 C) (Oral)  Resp 16  Ht _0  (1.676 m)  Wt 200 lb 9.6 oz (90.992 kg)  BMI 32.39 kg/m2  SpO2 98%  LMP 11/12/2014   Abdominal: Soft. Normal appearance and bowel sounds are normal. There is no tenderness. There is no rigidity, no rebound, no guarding, no CVA tenderness, no tenderness at McBurney's point and negative Murphy's sign.  Neurological: She is oriented to person, place, and time.  Psychiatric: She has a normal mood and affect. Her speech is normal and behavior is normal.   Results for orders placed or performed in visit on 12/03/14  POCT urinalysis dipstick  Result Value Ref Range   Color, UA yellow    Clarity, UA clear    Glucose, UA negative    Bilirubin, UA negative    Ketones, UA negative    Spec Grav, UA <=1.005    Blood, UA negative    pH, UA 6.0    Protein, UA negative    Urobilinogen, UA 0.2    Nitrite, UA negative     Leukocytes, UA Negative Negative  POCT UA - Microscopic Only  Result Value Ref Range   WBC, Ur, HPF, POC negative    RBC, urine, microscopic 0-1    Bacteria, U Microscopic negative    Mucus, UA negative    Epithelial cells, urine per micros 0-2    Crystals, Ur, HPF, POC negative    Casts, Ur, LPF, POC negative    Yeast, UA negative   POCT CBC  Result Value Ref Range   WBC 4.2 (A) 4.6 - 10.2 K/uL   Lymph, poc 1.8 0.6 - 3.4   POC LYMPH PERCENT 42.3 10 - 50 %L   MID (cbc) 0.4 0 - 0.9   POC MID % 8.8 0 - 12 %M   POC Granulocyte 2.1 2 - 6.9   Granulocyte percent 48.9 37 - 80 %G   RBC 3.96 (A) 4.04 - 5.48 M/uL   Hemoglobin 11.4 (A) 12.2 - 16.2 g/dL   HCT, POC 34.8 (A) 37.7 - 47.9 %   MCV 88.0 80 - 97 fL   MCH, POC 28.9 27 - 31.2 pg   MCHC 32.8 31.8 - 35.4 g/dL   RDW, POC 14.0 %   Platelet Count, POC 308 142 - 424 K/uL   MPV 5.9 0 - 99.8 fL      Assessment & Plan:  Diarrhea - Plan: POCT urinalysis dipstick, POCT UA - Microscopic Only, POCT CBC, Comprehensive metabolic panel, Stool culture, Ova and parasite examination, Fecal Lactoferrin  Gastroenteritis - Plan: POCT urinalysis dipstick, POCT UA - Microscopic Only, POCT CBC, Comprehensive metabolic panel --vital signs, exam, wbc ct, urine sample all reassuring --suspect viral gastroenteritis as no bloody diarrhea, no fevers, no abd pain --no dehydration on ua which is reassuring in this breastfeeding mother with diarrhea --no imodium due to lactation --rtc 1-2 with stool sample if diarrhea persists, kit given to pt to take home  Julieta Gutting, PA-C Physician Assistant-Certified [facility]  12/03/2014 3:34 PM

## 2014-12-07 ENCOUNTER — Encounter: Payer: Self-pay | Admitting: Family Medicine

## 2014-12-21 ENCOUNTER — Telehealth: Payer: Self-pay

## 2014-12-21 NOTE — Telephone Encounter (Signed)
Pt called. Let her know her labs were normal and that we sent a letter

## 2015-05-05 IMAGING — CR DG CHEST 2V
2 series · 2 of 2 positions shown · non-contrast
Comparison: None.

CLINICAL DATA: Shortness of breath. Chest pain. Cough. Congestion.

EXAM:
CHEST  2 VIEW

[w chest pa]
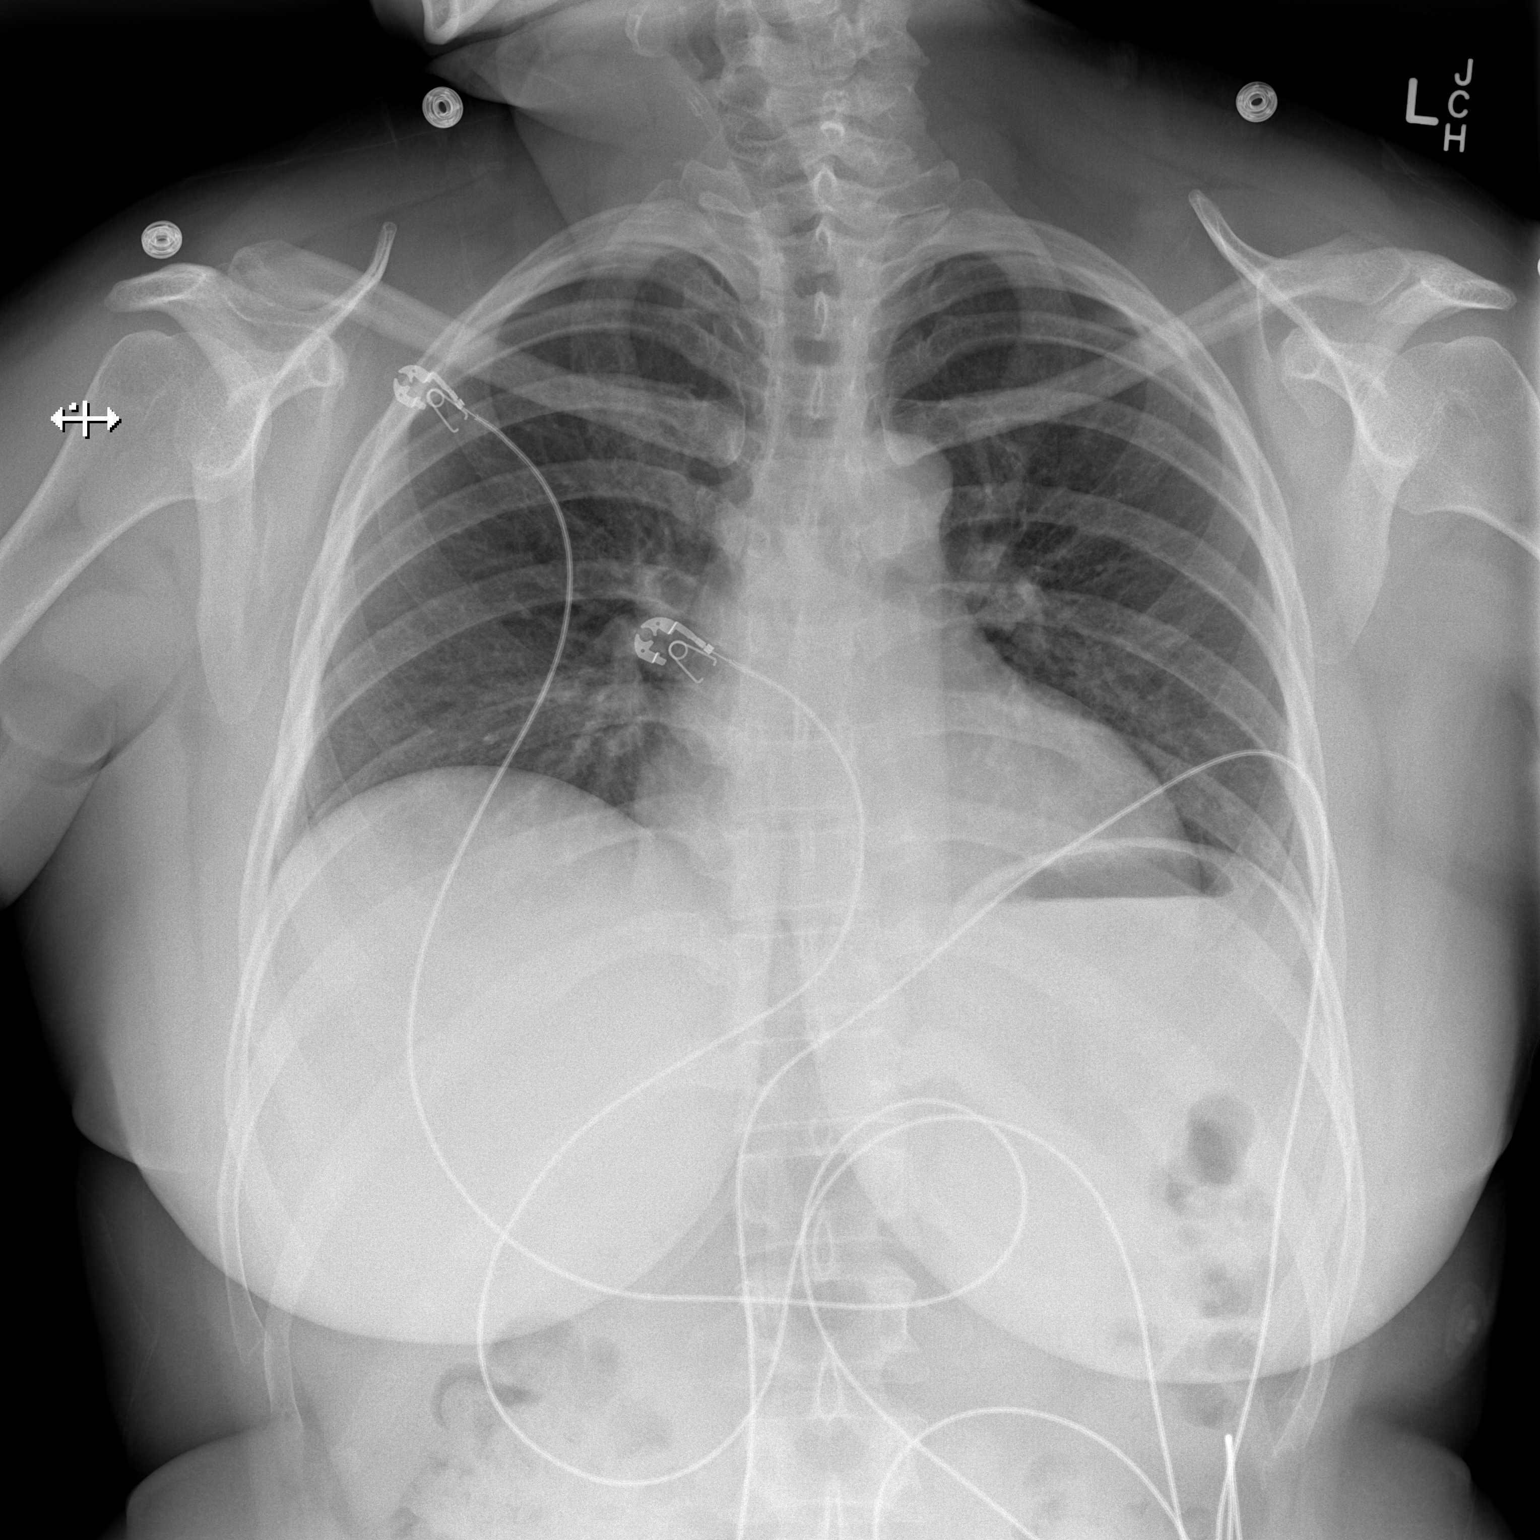

[w chest lat]
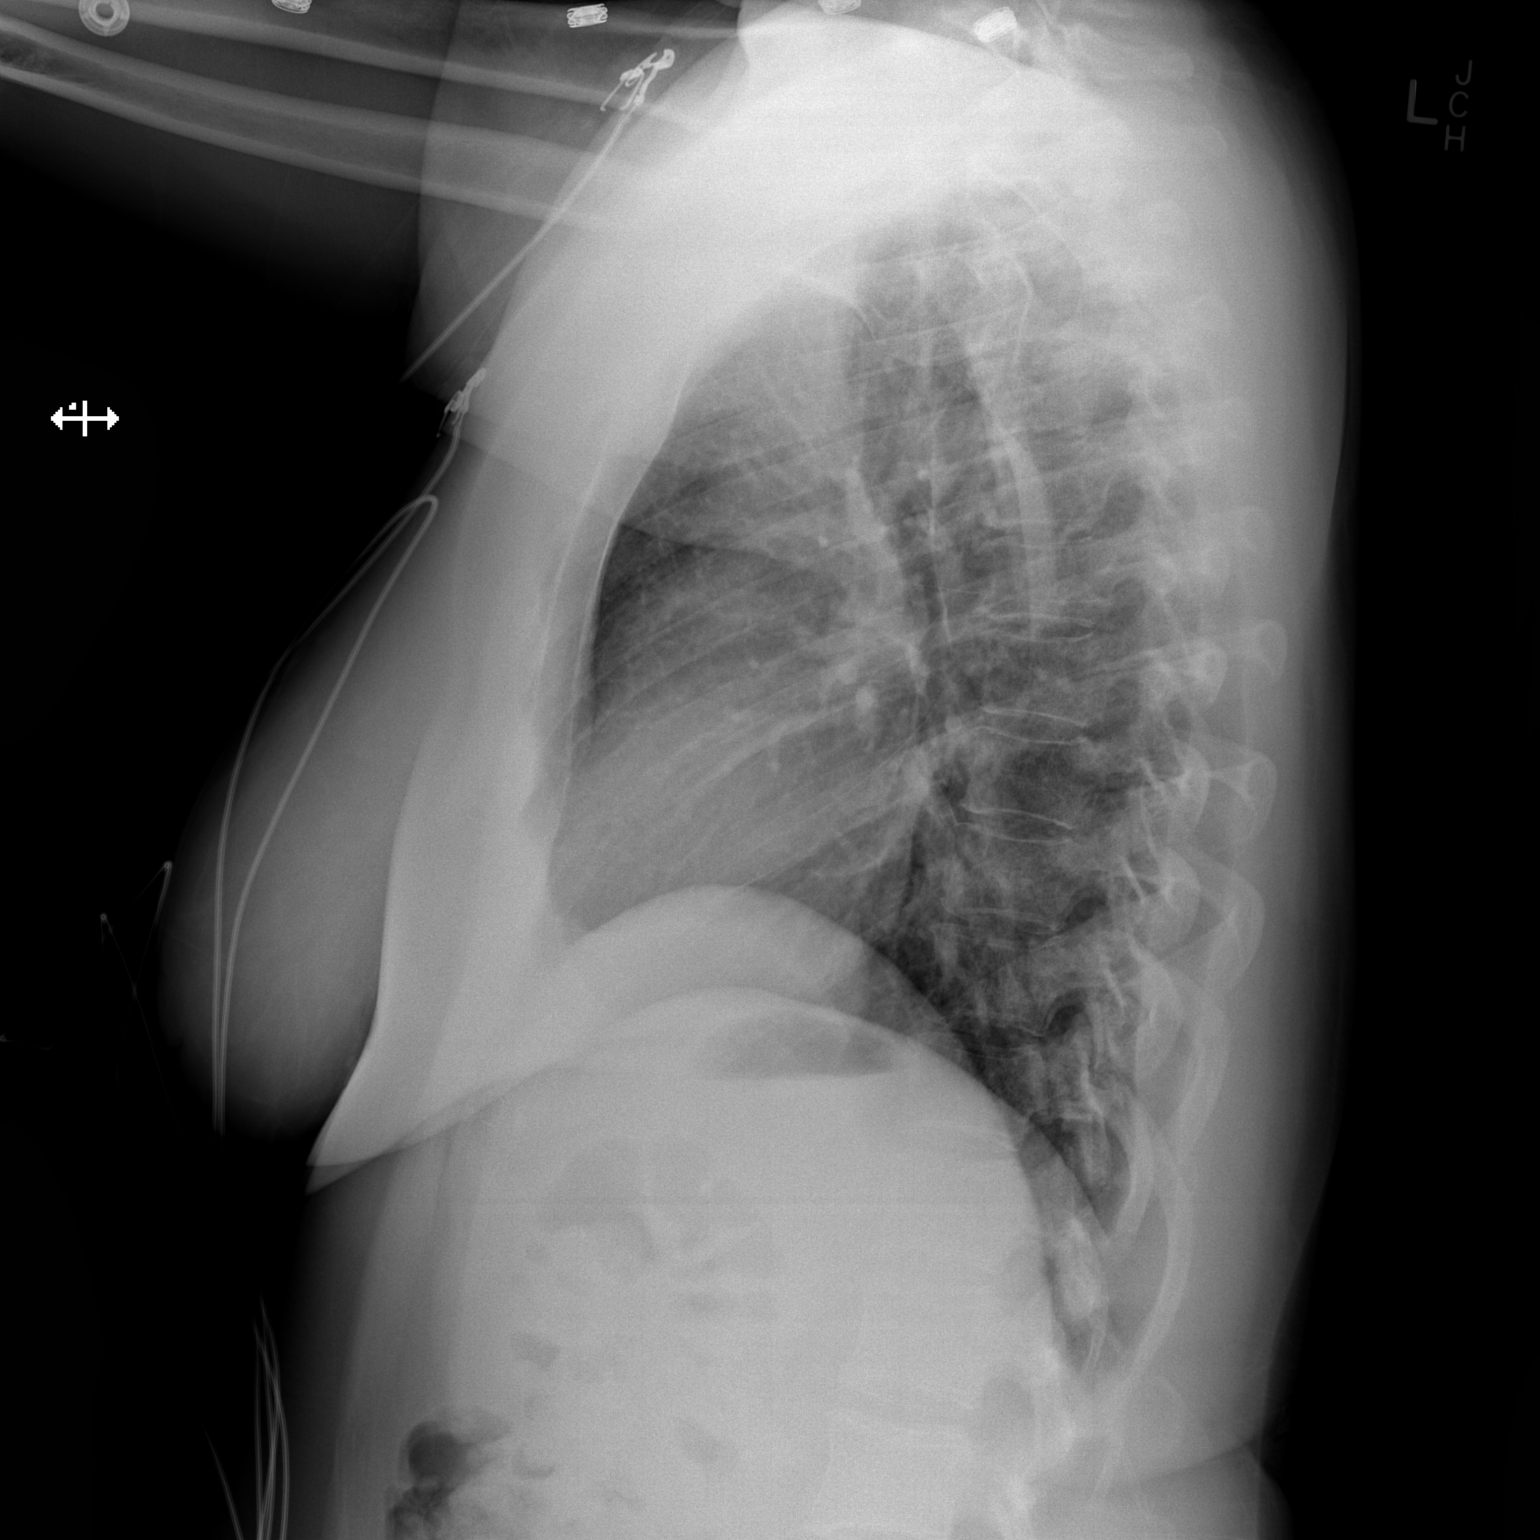

[2 of 2 positions shown; findings below may reference images not displayed]

FINDINGS: Low lung volumes are present, causing crowding of the pulmonary
vasculature.

Accounting for the low lung volumes, suspect that the heart size is
normal. Lungs appear clear. No pleural effusion.
IMPRESSION: Low lung volumes. Otherwise, no significant abnormalities are
observed.

## 2016-06-29 ENCOUNTER — Ambulatory Visit (INDEPENDENT_AMBULATORY_CARE_PROVIDER_SITE_OTHER): Payer: Commercial Managed Care - PPO | Admitting: Family Medicine

## 2016-06-29 ENCOUNTER — Ambulatory Visit (HOSPITAL_COMMUNITY)
Admission: RE | Admit: 2016-06-29 | Discharge: 2016-06-29 | Disposition: A | Discharge status: Home / Self Care | Payer: Commercial Managed Care - PPO | Source: Ambulatory Visit | Attending: Family Medicine | Admitting: Family Medicine

## 2016-06-29 VITALS — BP 122/90 | HR 80 | Temp 98.2°F | Resp 16 | Ht 67.0 in | Wt 209.0 lb

## 2016-06-29 DIAGNOSIS — G44209 Tension-type headache, unspecified, not intractable: Secondary | ICD-10-CM | POA: Insufficient documentation

## 2016-06-29 DIAGNOSIS — R29898 Other symptoms and signs involving the musculoskeletal system: Secondary | ICD-10-CM

## 2016-06-29 DIAGNOSIS — E6609 Other obesity due to excess calories: Secondary | ICD-10-CM

## 2016-06-29 DIAGNOSIS — Z6832 Body mass index (BMI) 32.0-32.9, adult: Secondary | ICD-10-CM

## 2016-06-29 DIAGNOSIS — R42 Dizziness and giddiness: Secondary | ICD-10-CM | POA: Diagnosis not present

## 2016-06-29 DIAGNOSIS — M47892 Other spondylosis, cervical region: Secondary | ICD-10-CM | POA: Insufficient documentation

## 2016-06-29 NOTE — Patient Instructions (Signed)
     IF you received an x-ray today, you will receive an invoice from [facility]. Please contact [facility] at [phone] with questions or concerns regarding your invoice.   IF you received labwork today, you will receive an invoice from [facility]. Please contact [facility] at [phone] with questions or concerns regarding your invoice.   Our billing staff will not be able to assist you with questions regarding bills from these companies.  You will be contacted with the lab results as soon as they are available. The fastest way to get your results is to activate your My Chart account. Instructions are located on the last page of this paperwork. If you have not heard from us regarding the results in 2 weeks, please contact this office.     

## 2016-06-29 NOTE — Progress Notes (Signed)
Chief Complaint  Patient presents with  . 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  She denies seasonal allergies She does not normally wear glasses  She denies family history of strokes or intracranial disease Her sister struggles with migraines She admits to being under a lot of stress She denies head trauma  Past Medical History:  Diagnosis Date  . No pertinent past medical history   . Postpartum care and examination of lactating mother 11/20/2010  . Postpartum care following vaginal delivery (4/22) 08/31/2014    No current outpatient prescriptions on file.   No current facility-administered medications for this visit.     Allergies: No Known Allergies  Past Surgical History:  Procedure Laterality Date  . CERVICAL CERCLAGE    . CERVICAL CERCLAGE N/A 02/21/2014   Procedure: Modified McDonald CERVICAL CERCLAGE ;  Surgeon: Genia DelMarie-Lyne Lavoie, MD;  Location: [facility];  Service: Gynecology;  Laterality: N/A;  EDD: 09/05/14  . HEMORRHOID SURGERY     after delivery  . LAPAROSCOPIC TUBAL LIGATION Bilateral 09/11/2014   Procedure: LAPAROSCOPIC TUBAL LIGATION With Cautery;  Surgeon: Genia DelMarie-Lyne Lavoie, MD;  Location: [facility];  Service:  Gynecology;  Laterality: Bilateral;    Social History   Social History  . Marital status: Married    Spouse name: N/A  . Number of children: N/A  . Years of education: N/A   Social History Main Topics  . Smoking status: Never Smoker  . Smokeless tobacco: Never Used  . Alcohol use No  . Drug use: No  . Sexual activity: Yes   Other Topics Concern  . None   Social History Narrative  . None    Review of Systems  Constitutional: Negative for chills and fever.  HENT: Negative for ear pain, hearing loss and tinnitus.   Eyes: Negative for blurred vision, double vision and photophobia.  Respiratory: Negative for cough, shortness of breath and wheezing.   Cardiovascular: Negative for chest pain, palpitations and orthopnea.  Gastrointestinal: Negative for abdominal pain, diarrhea, nausea and vomiting.  Genitourinary: Negative for dysuria, frequency and urgency.  Musculoskeletal: Negative for back pain, joint pain and neck pain.  Skin: Negative for itching and rash.  Neurological: Positive for dizziness, tingling, focal weakness and headaches. Negative for tremors, sensory change, speech change, seizures and loss of consciousness.  Endo/Heme/Allergies: Negative for environmental allergies.  Psychiatric/Behavioral: Negative for depression. The patient is not nervous/anxious.     Objective: Vitals:   06/29/16 1357  BP: 122/90  Pulse: 80  Resp: 16  Temp: 98.2 F (36.8 C)  TempSrc: Oral  SpO2: 99%  Weight: 209 lb (94.8 kg)  Height: 5\' 7"  (1.702 m)   Body  mass index is 32.73 kg/m.  Physical Exam  Constitutional: She is oriented to person, place, and time. She appears well-developed and well-nourished.  HENT:  Head: Normocephalic and atraumatic.  Right Ear: External ear normal.  Left Ear: External ear normal.  Nose: Nose normal.  Mouth/Throat: Oropharynx is clear and moist.  Eyes: Conjunctivae and EOM are normal.  Neck: Normal range of motion. Neck supple.    Cardiovascular: Normal rate, regular rhythm, normal heart sounds and intact distal pulses.   No murmur heard. Pulmonary/Chest: Effort normal and breath sounds normal. No respiratory distress. She has no wheezes.  Abdominal: Soft. Bowel sounds are normal. She exhibits no distension.  Musculoskeletal: Normal range of motion. She exhibits no edema.  Neurological: She is alert and oriented to person, place, and time. She displays normal reflexes. No cranial nerve deficit or sensory deficit. She exhibits normal muscle tone. Coordination normal. GCS eye subscore is 4. GCS verbal subscore is 5. GCS motor subscore is 6.  Reflex Scores:      Brachioradialis reflexes are 2+ on the right side and 2+ on the left side.      Patellar reflexes are 2+ on the right side and 2+ on the left side. Skin: Skin is warm. Capillary refill takes less than 2 seconds. No erythema.  Psychiatric: She has a normal mood and affect. Her behavior is normal. Judgment and thought content normal.    Assessment and Plan Magaret was seen today for head pain.  Diagnoses and all orders for this visit:  Acute non intractable tension-type headache -     Basic metabolic panel -     CBC with Differential/Platelet -     CT Head Wo Contrast -     CT CERVICAL SPINE WO CONTRAST  Dizziness -     CT Head Wo Contrast  Hand weakness -     CT Head Wo Contrast -     CT CERVICAL SPINE WO CONTRAST  Class 1 obesity due to excess calories without serious comorbidity with body mass index (BMI) of 32.0 to 32.9 in adult -     Lipid panel -     Hemoglobin A1c  dbased on neurologic findings with her headache will get a stat ct head Did not call 911 since pt has no focal deficits currently Discussed that she should get her CT HEAD and cervical spine Likely migraine is the cause of this headaches Uncertain of the cause of her hand weakness but it could be due to c-spine arthritis   Reynaldo Rossman A Creta Levin

## 2016-06-30 ENCOUNTER — Telehealth: Payer: Self-pay

## 2016-06-30 LAB — CBC WITH DIFFERENTIAL/PLATELET
BASOS ABS: 0 10*3/uL (ref 0.0–0.2)
BASOS: 0 %
EOS (ABSOLUTE): 0.1 10*3/uL (ref 0.0–0.4)
Eos: 1 %
Hematocrit: 38 % (ref 34.0–46.6)
Hemoglobin: 12.2 g/dL (ref 11.1–15.9)
IMMATURE GRANS (ABS): 0 10*3/uL (ref 0.0–0.1)
Immature Granulocytes: 0 %
Lymphocytes Absolute: 2.2 10*3/uL (ref 0.7–3.1)
Lymphs: 32 %
MCH: 27.8 pg (ref 26.6–33.0)
MCHC: 32.1 g/dL (ref 31.5–35.7)
MCV: 87 fL (ref 79–97)
Monocytes Absolute: 0.6 10*3/uL (ref 0.1–0.9)
Monocytes: 9 %
NEUTROS PCT: 58 %
Neutrophils Absolute: 4 10*3/uL (ref 1.4–7.0)
PLATELETS: 332 10*3/uL (ref 150–379)
RBC: 4.39 x10E6/uL (ref 3.77–5.28)
RDW: 14.5 % (ref 12.3–15.4)
WBC: 6.9 10*3/uL (ref 3.4–10.8)

## 2016-06-30 LAB — BASIC METABOLIC PANEL
BUN/Creatinine Ratio: 23 (ref 9–23)
BUN: 11 mg/dL (ref 6–20)
CHLORIDE: 101 mmol/L (ref 96–106)
CO2: 23 mmol/L (ref 18–29)
Calcium: 9.2 mg/dL (ref 8.7–10.2)
Creatinine, Ser: 0.48 mg/dL — ABNORMAL LOW (ref 0.57–1.00)
GFR calc Af Amer: 143 mL/min/{1.73_m2} (ref >59)
GFR, EST NON AFRICAN AMERICAN: 124 mL/min/{1.73_m2} (ref >59)
Glucose: 79 mg/dL (ref 65–99)
Potassium: 4.9 mmol/L (ref 3.5–5.2)
Sodium: 142 mmol/L (ref 134–144)

## 2016-06-30 LAB — LIPID PANEL
CHOLESTEROL TOTAL: 143 mg/dL (ref 100–199)
Chol/HDL Ratio: 5.5 ratio units — ABNORMAL HIGH (ref 0.0–4.4)
HDL: 26 mg/dL — ABNORMAL LOW (ref >39)
LDL Calculated: 50 mg/dL (ref 0–99)
TRIGLYCERIDES: 336 mg/dL — AB (ref 0–149)
VLDL Cholesterol Cal: 67 mg/dL — ABNORMAL HIGH (ref 5–40)

## 2016-06-30 LAB — HEMOGLOBIN A1C
Est. average glucose Bld gHb Est-mCnc: 105 mg/dL
Hgb A1c MFr Bld: 5.3 % (ref 4.8–5.6)

## 2016-06-30 NOTE — Telephone Encounter (Signed)
Pt was seen yesterday for pain on head and had blood work done during appointment. [facility] Pt would like results for both the lab and x-ray. Best callback number is [phone].

## 2016-07-01 NOTE — Telephone Encounter (Signed)
Using translator communicated that ct head and neck were grossly normal with minor changes.  Her labs were normal overall.  Would like her to return to clinic to discuss her labs, her ct head and neck and come up with some treatment plans to address the headaches. She agreed to return to clinic in one week next Wednesday.  Translator # [phone]

## 2016-07-17 ENCOUNTER — Ambulatory Visit (INDEPENDENT_AMBULATORY_CARE_PROVIDER_SITE_OTHER): Payer: Commercial Managed Care - PPO | Admitting: Family Medicine

## 2016-07-17 VITALS — BP 120/80 | HR 83 | Temp 98.5°F | Resp 16 | Ht 67.0 in | Wt 206.0 lb

## 2016-07-17 DIAGNOSIS — R5382 Chronic fatigue, unspecified: Secondary | ICD-10-CM

## 2016-07-17 DIAGNOSIS — F4323 Adjustment disorder with mixed anxiety and depressed mood: Secondary | ICD-10-CM | POA: Diagnosis not present

## 2016-07-17 DIAGNOSIS — R413 Other amnesia: Secondary | ICD-10-CM

## 2016-07-17 MED ORDER — ESCITALOPRAM OXALATE 10 MG PO TABS: 10.0000 mg | ORAL_TABLET | Freq: Every day | ORAL | 6 refills | Status: DC

## 2016-07-17 NOTE — Progress Notes (Signed)
Chief Complaint  Patient presents with  . Follow-up    Headache  . Depression    See screening  . Memory Loss    Has been having short term memory issues    HPI  Fatigue and Mood Changes Pt reports increasing fatigue  She states that she has to prepare meals for her children and her husband. Her husband is undocumented and is in sanctuary at a church. She then goes to work and has to [employer] at work. Then on her way home she picks up her youngest child and goes home to take care of her [age] y, 89 yo and the baby 1yo She reports that she is under increasing stress as a result of this situation She states that she cannot go to sleep until she cooks and cleans for the next day She gets to bed and she gets about 5 hours of sleep but it just never feels like it is enough She reports that she has a headache that feels like a sharp stab in her scalp.  Memory Changes She reports that she has been having difficulty with memory as well.  She describes it as walking across the kitchen to the closet and not knowing why she is there. She reports that she feels increasing forgetful. She does not drink any alcohol.  She is not on any allergy medications like benadryl She denies hallucinations There is no ringing in her ears She denies hyperactivity She reports that she moves a little slower than usual.  [facility] 07/17/2016 06/29/2016 12/03/2014  Decreased Interest 1 0 0  Down, Depressed, Hopeless 1 1 0  PHQ - 2 Score 2 1 0  Altered sleeping 0 - -  Tired, decreased energy 3 - -  Change in appetite 0 - -  Feeling bad or failure about yourself  3 - -  Trouble concentrating 3 - -  Moving slowly or fidgety/restless 0 - -  Suicidal thoughts 0 - -  PHQ-9 Score 11 - -  Difficult doing work/chores Somewhat difficult - -     Past Medical History:  Diagnosis Date  . No pertinent past medical history   . Postpartum care and examination of lactating mother 11/20/2010  . Postpartum care  following vaginal delivery (4/22) 08/31/2014    Current Outpatient Prescriptions  Medication Sig Dispense Refill  . escitalopram (LEXAPRO) 10 MG tablet Take 1 tablet (10 mg total) by mouth daily. 30 tablet 6   No current facility-administered medications for this visit.     Allergies: No Known Allergies  Past Surgical History:  Procedure Laterality Date  . CERVICAL CERCLAGE    . CERVICAL CERCLAGE N/A 02/21/2014   Procedure: Modified McDonald CERVICAL CERCLAGE ;  Surgeon: Stacey DelMarie-Lyne Lavoie, MD;  Location: [facility];  Service: Gynecology;  Laterality: N/A;  EDD: 09/05/14  . HEMORRHOID SURGERY     after delivery  . LAPAROSCOPIC TUBAL LIGATION Bilateral 09/11/2014   Procedure: LAPAROSCOPIC TUBAL LIGATION With Cautery;  Surgeon: Stacey DelMarie-Lyne Lavoie, MD;  Location: [facility];  Service: Gynecology;  Laterality: Bilateral;    Social History   Social History  . Marital status: Married    Spouse name: N/A  . Number of children: N/A  . Years of education: N/A   Social History Main Topics  . Smoking status: Never Smoker  . Smokeless tobacco: Never Used  . Alcohol use No  . Drug use: No  . Sexual activity: Yes   Other Topics Concern  . None  Social History Narrative  . None    Review of Systems  Constitutional: Negative for chills, fever and weight loss.  Cardiovascular: Negative for chest pain, palpitations, orthopnea and claudication.  Gastrointestinal: Negative for abdominal pain, nausea and vomiting.  Psychiatric/Behavioral: Negative for hallucinations, substance abuse and suicidal ideas.   See  hpi  Objective: Vitals:   07/17/16 0932  BP: 120/80  Pulse: 83  Resp: 16  Temp: 98.5 F (36.9 C)  TempSrc: Oral  SpO2: 99%  Weight: 206 lb (93.4 kg)  Height: 5\' 7"  (1.702 m)   Wt Readings from Last 3 Encounters:  07/17/16 206 lb (93.4 kg)  06/29/16 209 lb (94.8 kg)  12/03/14 200 lb 9.6 oz (91 kg)   BP Readings from Last 3 Encounters:  07/17/16 120/80  06/29/16 122/90    12/03/14 112/80     Physical Exam  Constitutional: She is oriented to person, place, and time. She appears well-developed and well-nourished.  HENT:  Head: Normocephalic and atraumatic.  Right Ear: External ear normal.  Left Ear: External ear normal.  Eyes: Conjunctivae and EOM are normal.  Neck: Normal range of motion. Neck supple.  Cardiovascular: Normal rate, regular rhythm and normal heart sounds.   No murmur heard. Pulmonary/Chest: Effort normal and breath sounds normal. No respiratory distress. She has no wheezes.  Neurological: She is alert and oriented to person, place, and time. She displays normal reflexes. No cranial nerve deficit. Coordination normal.  Skin: Skin is warm. Capillary refill takes less than 2 seconds. No erythema.  Psychiatric: Her behavior is normal. Judgment and thought content normal.  Tearful affect    IMPRESSION: 1. Negative head CT. 2. No evidence of acute osseous abnormality in the cervical spine. 3. Very mild cervical spondylosis.   Electronically Signed   By: [name] M.D.   On: 06/29/2016 16:21  Assessment and Plan [name] was seen today for follow-up, depression and memory loss.  Diagnoses and all orders for this visit:  Memory changes- discussed that her chronic fatigue is likely causing her inattention Will screen for other causes -     TSH -     Vitamin B12  Adjustment disorder with mixed anxiety and depressed mood- discussed treating this for at least 6 months with lexapro which can even out her mood -     TSH  Chronic fatigue- discussed that she may have other contributing factors making her have increasing fatigue Will check -     TSH -     Vitamin B12 -     VITAMIN D 25 Hydroxy (Vit-D Deficiency, Fractures) -     Folate  Other orders -     escitalopram (LEXAPRO) 10 MG tablet; Take 1 tablet (10 mg total) by mouth daily.     [name] A Carmel Wu

## 2016-07-17 NOTE — Patient Instructions (Signed)
     IF you received an x-ray today, you will receive an invoice from [facility]. Please contact [facility] at [phone] with questions or concerns regarding your invoice.   IF you received labwork today, you will receive an invoice from [facility]. Please contact [facility] at [phone] with questions or concerns regarding your invoice.   Our billing staff will not be able to assist you with questions regarding bills from these companies.  You will be contacted with the lab results as soon as they are available. The fastest way to get your results is to activate your My Chart account. Instructions are located on the last page of this paperwork. If you have not heard from us regarding the results in 2 weeks, please contact this office.     

## 2016-07-18 LAB — FOLATE

## 2016-07-18 LAB — VITAMIN B12: Vitamin B-12: 484 pg/mL (ref 232–1245)

## 2016-07-18 LAB — VITAMIN D 25 HYDROXY (VIT D DEFICIENCY, FRACTURES): VIT D 25 HYDROXY: 17.4 ng/mL — AB (ref 30.0–100.0)

## 2016-07-18 LAB — TSH: TSH: 2.67 u[IU]/mL (ref 0.450–4.500)

## 2016-07-20 DIAGNOSIS — R5382 Chronic fatigue, unspecified: Secondary | ICD-10-CM | POA: Insufficient documentation

## 2016-07-20 DIAGNOSIS — F4323 Adjustment disorder with mixed anxiety and depressed mood: Secondary | ICD-10-CM | POA: Insufficient documentation

## 2016-07-23 ENCOUNTER — Encounter: Payer: Self-pay | Admitting: Family Medicine

## 2016-10-28 ENCOUNTER — Emergency Department (HOSPITAL_COMMUNITY): Payer: Commercial Managed Care - PPO

## 2016-10-28 ENCOUNTER — Encounter (HOSPITAL_COMMUNITY): Payer: Self-pay

## 2016-10-28 ENCOUNTER — Emergency Department (HOSPITAL_COMMUNITY)
Admission: EM | Admit: 2016-10-28 | Discharge: 2016-10-28 | Disposition: A | Discharge status: Home / Self Care | Payer: Commercial Managed Care - PPO | Attending: Emergency Medicine | Admitting: Emergency Medicine

## 2016-10-28 DIAGNOSIS — Z79899 Other long term (current) drug therapy: Secondary | ICD-10-CM | POA: Diagnosis not present

## 2016-10-28 DIAGNOSIS — H53143 Visual discomfort, bilateral: Secondary | ICD-10-CM | POA: Diagnosis not present

## 2016-10-28 DIAGNOSIS — I10 Essential (primary) hypertension: Secondary | ICD-10-CM | POA: Diagnosis not present

## 2016-10-28 DIAGNOSIS — R51 Headache: Secondary | ICD-10-CM | POA: Insufficient documentation

## 2016-10-28 DIAGNOSIS — R519 Headache, unspecified: Secondary | ICD-10-CM

## 2016-10-28 DIAGNOSIS — R42 Dizziness and giddiness: Secondary | ICD-10-CM

## 2016-10-28 LAB — CBC WITH DIFFERENTIAL/PLATELET
BASOS PCT: 0 %
Basophils Absolute: 0 10*3/uL (ref 0.0–0.1)
Eosinophils Absolute: 0 10*3/uL (ref 0.0–0.7)
Eosinophils Relative: 0 %
HCT: 37.7 % (ref 36.0–46.0)
HEMOGLOBIN: 12.3 g/dL (ref 12.0–15.0)
Lymphocytes Relative: 23 %
Lymphs Abs: 1.6 10*3/uL (ref 0.7–4.0)
MCH: 26.9 pg (ref 26.0–34.0)
MCHC: 32.6 g/dL (ref 30.0–36.0)
MCV: 82.3 fL (ref 78.0–100.0)
MONOS PCT: 6 %
Monocytes Absolute: 0.4 10*3/uL (ref 0.1–1.0)
NEUTROS PCT: 71 %
Neutro Abs: 5.1 10*3/uL (ref 1.7–7.7)
Platelets: 279 10*3/uL (ref 150–400)
RBC: 4.58 MIL/uL (ref 3.87–5.11)
RDW: 14.3 % (ref 11.5–15.5)
WBC: 7.1 10*3/uL (ref 4.0–10.5)

## 2016-10-28 LAB — COMPREHENSIVE METABOLIC PANEL
ALK PHOS: 65 U/L (ref 38–126)
ALT: 27 U/L (ref 14–54)
AST: 32 U/L (ref 15–41)
Albumin: 4.4 g/dL (ref 3.5–5.0)
Anion gap: 9 (ref 5–15)
BILIRUBIN TOTAL: 0.2 mg/dL — AB (ref 0.3–1.2)
BUN: 7 mg/dL (ref 6–20)
CALCIUM: 8.7 mg/dL — AB (ref 8.9–10.3)
CO2: 21 mmol/L — AB (ref 22–32)
CREATININE: 0.56 mg/dL (ref 0.44–1.00)
Chloride: 107 mmol/L (ref 101–111)
Glucose, Bld: 110 mg/dL — ABNORMAL HIGH (ref 65–99)
Potassium: 4.1 mmol/L (ref 3.5–5.1)
SODIUM: 137 mmol/L (ref 135–145)
Total Protein: 7.7 g/dL (ref 6.5–8.1)

## 2016-10-28 LAB — I-STAT BETA HCG BLOOD, ED (MC, WL, AP ONLY)

## 2016-10-28 LAB — PROTIME-INR
INR: 0.95
Prothrombin Time: 12.7 seconds (ref 11.4–15.2)

## 2016-10-28 MED ORDER — MECLIZINE HCL 25 MG PO TABS
25.0000 mg | ORAL_TABLET | Freq: Once | ORAL | Status: AC
  Administered 2016-10-28: 25 mg via ORAL
  Filled 2016-10-28: qty 1

## 2016-10-28 MED ORDER — PROCHLORPERAZINE EDISYLATE 5 MG/ML IJ SOLN
10.0000 mg | Freq: Once | INTRAMUSCULAR | Status: AC
  Administered 2016-10-28: 10 mg via INTRAVENOUS
  Filled 2016-10-28: qty 2

## 2016-10-28 MED ORDER — LORAZEPAM 2 MG/ML IJ SOLN: 1.0000 mg | Freq: Once | INTRAMUSCULAR | Status: DC

## 2016-10-28 MED ORDER — MECLIZINE HCL 25 MG PO TABS: 25.0000 mg | ORAL_TABLET | Freq: Three times a day (TID) | ORAL | 0 refills | Status: AC

## 2016-10-28 MED ORDER — IOPAMIDOL (ISOVUE-370) INJECTION 76%
INTRAVENOUS | Status: AC
  Filled 2016-10-28: qty 100

## 2016-10-28 MED ORDER — DEXAMETHASONE SODIUM PHOSPHATE 10 MG/ML IJ SOLN
10.0000 mg | Freq: Once | INTRAMUSCULAR | Status: AC
  Administered 2016-10-28: 10 mg via INTRAVENOUS
  Filled 2016-10-28: qty 1

## 2016-10-28 MED ORDER — IOPAMIDOL (ISOVUE-370) INJECTION 76%
100.0000 mL | Freq: Once | INTRAVENOUS | Status: AC | PRN
  Administered 2016-10-28: 75 mL via INTRAVENOUS

## 2016-10-28 MED ORDER — SODIUM CHLORIDE 0.9 % IV BOLUS (SEPSIS)
1000.0000 mL | Freq: Once | INTRAVENOUS | Status: AC
  Administered 2016-10-28: 1000 mL via INTRAVENOUS

## 2016-10-28 MED ORDER — TRIAMCINOLONE ACETONIDE 55 MCG/ACT NA AERO
2.0000 | INHALATION_SPRAY | Freq: Every day | NASAL | Status: DC
  Filled 2016-10-28: qty 21.6

## 2016-10-28 MED ORDER — ONDANSETRON HCL 4 MG/2ML IJ SOLN: 4.0000 mg | Freq: Once | INTRAMUSCULAR | Status: DC

## 2016-10-28 MED ORDER — LORAZEPAM 1 MG PO TABS: 1.0000 mg | ORAL_TABLET | Freq: Three times a day (TID) | ORAL | 0 refills | Status: DC | PRN

## 2016-10-28 NOTE — ED Notes (Signed)
Patient reports that dizziness started when she had to move from ED bed to CT exam table. Patient requesting something to drink.  Informed patient will ask EDP.

## 2016-10-28 NOTE — ED Notes (Signed)
Patient returned from CT

## 2016-10-28 NOTE — ED Triage Notes (Signed)
She reports headache and dizziness this morning. She also vomited x 1 and rec'd. IV Zofran en route to hospital. Her daughter and Laneta Simmerspastora are with her.

## 2016-10-28 NOTE — ED Notes (Signed)
Bed: WA13 Expected date:  Expected time:  Means of arrival:  Comments: Triage 2 

## 2016-10-28 NOTE — Discharge Instructions (Signed)
As discussed, your evaluation today has been largely reassuring.  But, it is important that you monitor your condition carefully, and do not hesitate to return to the ED if you develop new, or concerning changes in your condition. ? ?Otherwise, please follow-up with your physician for appropriate ongoing care. ? ?

## 2016-10-28 NOTE — ED Provider Notes (Signed)
[facility]-EMERGENCY DEPT Provider Note   CSN: [id number] Arrival date & time: 10/28/16  0726     History   Chief Complaint Chief Complaint  Patient presents with  . Headache  . Dizziness    HPI [name] is a 41 y.o. female.  HPI  Patient presents with headache. Patient does have a history of headaches, as well as hypertension, but takes no medication for either regularly. Today she woke, within the past few hours with severe diffuse headache. There is associated dizziness, photophobia, but no vision loss. Headache is severe, sore. Patient had multiple episodes of vomiting, has been intolerant of oral intake, thus, no medication has been taken for pain relief. Patient is here with her daughter and a clergy member who assists with the history of present illness.    Past Medical History:  Diagnosis Date  . No pertinent past medical history   . Postpartum care and examination of lactating mother 11/20/2010  . Postpartum care following vaginal delivery (4/22) 08/31/2014    Patient Active Problem List   Diagnosis Date Noted  . Adjustment disorder with mixed anxiety and depressed mood 07/20/2016  . Chronic fatigue 07/20/2016    Past Surgical History:  Procedure Laterality Date  . CERVICAL CERCLAGE    . CERVICAL CERCLAGE N/A 02/21/2014   Procedure: Modified McDonald CERVICAL CERCLAGE ;  Surgeon: Genia Del, MD;  Location: [facility];  Service: Gynecology;  Laterality: N/A;  EDD: 09/05/14  . HEMORRHOID SURGERY     after delivery  . LAPAROSCOPIC TUBAL LIGATION Bilateral 09/11/2014   Procedure: LAPAROSCOPIC TUBAL LIGATION With Cautery;  Surgeon: Genia Del, MD;  Location: [facility];  Service: Gynecology;  Laterality: Bilateral;    OB History    Gravida Para Term Preterm AB Living   7 4 3  0 2 3   SAB TAB Ectopic Multiple Live Births   1 1 0 0 4       Home Medications    Prior to Admission medications   Medication Sig Start Date End Date Taking?  Authorizing Provider  escitalopram (LEXAPRO) 10 MG tablet Take 1 tablet (10 mg total) by mouth daily. Patient not taking: Reported on 10/28/2016 07/17/16   Doristine Bosworth, MD    Family History Family History  Problem Relation Age of Onset  . Heart disease Mother   . Hyperlipidemia Mother     Social History Social History  Substance Use Topics  . Smoking status: Never Smoker  . Smokeless tobacco: Never Used  . Alcohol use No     Allergies   Patient has no known allergies.   Review of Systems Review of Systems  Constitutional:       Per HPI, otherwise negative  HENT:       Per HPI, otherwise negative  Eyes: Positive for photophobia.  Respiratory:       Per HPI, otherwise negative  Cardiovascular:       Per HPI, otherwise negative  Gastrointestinal: Positive for nausea and vomiting.  Endocrine:       Negative aside from HPI  Genitourinary:       Neg aside from HPI   Musculoskeletal:       Per HPI, otherwise negative  Skin: Negative.   Neurological: Positive for light-headedness. Negative for syncope.     Physical Exam Updated Vital Signs BP 127/84 (BP Location: Left Arm)   Pulse 91   Temp 98.2 F (36.8 C) (Oral)   Resp 16   SpO2 99%   Physical  Exam  Constitutional: She is oriented to person, place, and time. She appears well-developed and well-nourished. No distress.  Uncomfortable appearing female resting in right lateral decubitus position eyes closed  HENT:  Head: Normocephalic and atraumatic.  Eyes: Conjunctivae are normal. Right eye exhibits nystagmus. Left eye exhibits nystagmus.  Neck: Normal range of motion. Neck supple.  No tenderness to palpation, patient does not hesitate to move the neck in any dimension.  Cardiovascular: Normal rate and regular rhythm.   Pulmonary/Chest: Effort normal and breath sounds normal. No stridor. No respiratory distress.  Abdominal: She exhibits no distension. There is no tenderness.  Musculoskeletal: She exhibits  no edema.  Neurological: She is alert and oriented to person, place, and time. She displays no atrophy and no tremor. No cranial nerve deficit. She exhibits normal muscle tone. She displays no seizure activity. Coordination normal.  Skin: Skin is warm and dry.  Psychiatric: She has a normal mood and affect.  Nursing note and vitals reviewed.    ED Treatments / Results  Labs (all labs ordered are listed, but only abnormal results are displayed) Labs Reviewed  COMPREHENSIVE METABOLIC PANEL - Abnormal; Notable for the following:       Result Value   CO2 21 (*)    Glucose, Bld 110 (*)    Calcium 8.7 (*)    Total Bilirubin 0.2 (*)    All other components within normal limits  CBC WITH DIFFERENTIAL/PLATELET  PROTIME-INR  I-STAT BETA HCG BLOOD, ED ([facility], [facility], [facility] ONLY)    Radiology Ct Angio Head W/cm &/or Wo Cm  Result Date: 10/28/2016 CLINICAL DATA:  Patient awoke with dizziness, headache, and vomiting. Vertigo when lying flat. EXAM: CT ANGIOGRAPHY HEAD TECHNIQUE: Multidetector CT imaging of the head was performed using the standard protocol during bolus administration of intravenous contrast. Multiplanar CT image reconstructions and MIPs were obtained to evaluate the vascular anatomy. CONTRAST:  75 mL Isovue 370 next healed COMPARISON:  CT head without contrast 06/29/2016. FINDINGS: CT HEAD Brain: Noncontrast imaging the head demonstrates no acute infarct, hemorrhage, or mass lesion. A cavum velum interpositum is noted. The ventricles are otherwise of normal size. Vascular: No hyperdense vessel or unexpected calcification. Skull: The calvarium is intact. No focal lytic or blastic lesions are present. Sinuses: Left maxillary and ethmoid sinus disease is increased from the prior exam. The remaining paranasal sinuses and mastoid air cells are clear. Orbits: Within normal limits CTA HEAD Anterior circulation: The internal carotid artery is are within normal limits from the high cervical segments  through the ICA termini bilaterally. The A1 and M1 segments are intact. The anterior communicating artery is patent. MCA bifurcations are within normal limits. The ACA and MCA branch vessels are unremarkable. Posterior circulation: The vertebral arteries are codominant. The PICA origins are visualized and normal. The vertebrobasilar junction is normal. Both posterior cerebral arteries originate from the basilar tip. The PCA branch vessels are normal. Venous sinuses: The paranasal sinuses are patent. The right transverse sinus is dominant. The straight sinus deep cerebral veins are within normal limits. Cortical veins are unremarkable. Anatomic variants: None Delayed phase: The postcontrast images demonstrate no pathologic enhancement. IMPRESSION: 1. No acute or focal lesion to explain the patient's acute onset of vertigo and headache. 2. Normal CTA circle of Willis. 3. Mild left maxillary and ethmoid sinus disease has developed. Electronically Signed   By: Marin Roberts M.D.   On: 10/28/2016 11:21    Procedures Procedures (including critical care time)  Medications Ordered in ED  Medications  triamcinolone (NASACORT) nasal inhaler 2 spray (not administered)  LORazepam (ATIVAN) injection 1 mg (not administered)  sodium chloride 0.9 % bolus 1,000 mL (1,000 mLs Intravenous New Bag/Given 10/28/16 0920)  prochlorperazine (COMPAZINE) injection 10 mg (10 mg Intravenous Given 10/28/16 0922)  iopamidol (ISOVUE-370) 76 % injection 100 mL (75 mLs Intravenous Contrast Given 10/28/16 1037)  meclizine (ANTIVERT) tablet 25 mg (25 mg Oral Given 10/28/16 1205)  dexamethasone (DECADRON) injection 10 mg (10 mg Intravenous Given 10/28/16 1210)     Initial Impression / Assessment and Plan / ED Course  I have reviewed the triage vital signs and the nursing notes.  Pertinent labs & imaging results that were available during my care of the patient were reviewed by me and considered in my medical decision making (see  chart for details).    On repeat exam the patient feels better, though she has some persistent dizziness.   Update:, Patient has received fluids, medication, has been ambulatory. No CT evidence for mass, hemorrhage, aneurysm, and although the patient described a more severe, atypical headache than usual, there suspicion for common migraine, with element of vertigo contribute to her symptoms. With her improvement here, patient discharged in stable condition with medication  Final Clinical Impressions(s) / ED Diagnoses  Bad headache vertigo   Gerhard MunchLockwood, Roisin Mones, MD 10/28/16 1357

## 2016-10-28 NOTE — ED Notes (Signed)
Patient denying anymore dizziness with movement or ambulating, just states that he head "feels heavy and I very tired".

## 2016-10-28 NOTE — ED Notes (Signed)
Patient given Sprite 

## 2016-10-30 ENCOUNTER — Ambulatory Visit (INDEPENDENT_AMBULATORY_CARE_PROVIDER_SITE_OTHER): Payer: Commercial Managed Care - PPO | Admitting: Family Medicine

## 2016-10-30 ENCOUNTER — Encounter: Payer: Self-pay | Admitting: Family Medicine

## 2016-10-30 VITALS — BP 138/85 | HR 81 | Temp 98.6°F | Resp 16 | Ht 67.0 in | Wt 205.8 lb

## 2016-10-30 DIAGNOSIS — F4323 Adjustment disorder with mixed anxiety and depressed mood: Secondary | ICD-10-CM | POA: Diagnosis not present

## 2016-10-30 DIAGNOSIS — R42 Dizziness and giddiness: Secondary | ICD-10-CM | POA: Diagnosis not present

## 2016-10-30 MED ORDER — ESCITALOPRAM OXALATE 10 MG PO TABS: 10.0000 mg | ORAL_TABLET | Freq: Every day | ORAL | 6 refills | Status: DC

## 2016-10-30 NOTE — Patient Instructions (Addendum)
IF you received an x-ray today, you will receive an invoice from [facility]. Please contact [facility] at [phone] with questions or concerns regarding your invoice.   IF you received labwork today, you will receive an invoice from [facility]. Please contact [facility] at [phone] with questions or concerns regarding your invoice.   Our billing staff will not be able to assist you with questions regarding bills from these companies.  You will be contacted with the lab results as soon as they are available. The fastest way to get your results is to activate your My Chart account. Instructions are located on the last page of this paperwork. If you have not heard from [place] regarding the results in 2 weeks, please contact this office.     Maniobra de Energy manager, cuidado personal Merchandiser, retail Self-Care) QU ES LA MANIOBRA DE EPLEY? La [employer] de [employer]pley es un ejercicio que puede Education officer, environmental para Paramedic los sntomas del vrtigo posicional paroxstico benigno (VPPB). Esta afeccin a menudo se conoce como vrtigo. El movimiento de unos pequeos cristales (canalculos) dentro del odo interno ocasiona el VPPB. La acumulacin y el movimiento de los canalculos en el odo interno ocasionan una repentina sensacin de aceleracin (vrtigo) cuando se mueve la cabeza en ciertas posiciones. El vrtigo por lo general dura unos 30das. El VPPB normalmente ocurre solo en un odo. Si siente vrtigo cuando se recuesta sobre el lado izquierdo, probablemente tenga VPPB en el odo izquierdo. El mdico le puede decir qu odo est involucrado. Una lesin en la cabeza puede ocasionar el VPPB. Muchas personas de ms de 50aos tienen VPPB por motivos desconocidos. Si se le diagnostic VPPB, el mdico puede ensearle a Firefighter. El VPPB no es potencialmente mortal (benigno) y normalmente se pasa con el tiempo. CUNDO DEBO REALIZAR LA MANIOBRA DE EPLEY? Puede realizar [employer] en su  casa cuando tenga los sntomas de vrtigo. Puede realizar Musician de Epley hasta 3veces en un da hasta que los sntomas de vrtigo desaparezcan. CMO DEBO REALIZAR LA MANIOBRA DE EPLEY? 1. Sintese en el borde de una cama o una mesa con la espalda recta. Las piernas deben estar extendidas o colgando sobre el borde de la cama o la mesa. 2. Gire la cabeza a medias hacia el lado del odo afectado. 3. Recustese hacia atrs con la cabeza girada hasta que se encuentre recostado sobre la espalda. Quizs quiera colocar una almohada debajo de los hombros. 4. Mantenga esta posicin durante 30segundos. Es posible que experimente un ataque de vrtigo. Esto es normal. Mantenga esta posicin hasta que el vrtigo desaparezca. 5. Luego gire la cabeza en direccin opuesta hasta que el odo no afectado est orientado al suelo. 6. Mantenga esta posicin durante 30segundos. Es posible que experimente un ataque de vrtigo. Esto es normal. Mantenga esta posicin hasta que el vrtigo desaparezca. 7. Ahora gire todo el cuerpo hacia el mismo lado que la cabeza. Mantenga esta posicin durante otros 30segundos. 8. Luego, vuelva a sentarse.  ESTA MANIOBRA PRESENTA RIESGOS? En algunos casos, puede tener otros sntomas (como cambios en la visin, debilidad o entumecimiento). Si tiene estos sntomas, deje de Clinical cytogeneticist y llame al mdico. Baron Sane si realizar esta maniobra lo [place] del vrtigo, es posible que sienta mareos. El mareo es la sensacin de desvanecimiento pero sin la sensacin de [place]. Aunque la [employer] [facility] de Energy manager lo [place] del vrtigo, es posible que los sntomas vuelvan durante los siguientes 5aos. QU DEBO HACER DESPUS DE ESTA MANIOBRA? Puede  retomar sus actividades normales despus de Education officer, environmentalrealizar la [place]ra de Energy managerpley. Pregntele al mdico si debe hacer algo en su casa para prevenir el vrtigo. Esta puede incluir:  Dormir con dos o ms almohadas para Pharmacologistmantener la cabeza elevada.  No  dormir sobre el lado del odo afectado.  Levantarse lentamente de la cama.  Evitar los movimientos repentinos Administratordurante el da.  Evitar los movimientos de cabeza intensos, como mirar hacia arriba o Public librarianagacharse.  Utilizar un collar cervical para evitar los movimientos de cabeza repentinos. QU DEBO HACER SI LOS SNTOMAS EMPEORAN? Llame al mdico si el vrtigo empeora. Llame al mdico inmediatamente si tiene otros sntomas, incluidos:  Nuseas.  Vmitos.  Dolor de [place].  Debilidad.  Entumecimiento.  Cambios en la visin. Esta informacin no tiene Theme park managercomo fin reemplazar el consejo del mdico. Asegrese de hacerle al mdico cualquier pregunta que tenga. Document Released: 05/02/2013 Document Revised: 05/02/2013 Document Reviewed: 03/21/2013 Elsevier Interactive Patient Education  2017 [employer]

## 2016-10-30 NOTE — Progress Notes (Signed)
Chief Complaint  Patient presents with  . Follow-up    HPI   Patient ws seen in the ER 10/28/16 for dizziness and nausea She had a headache She called 911 and went by ambulance She states that she had multiple episodes of vomiting and could not keep any food down.  She reports that she stopped taking the escitalopram as well because she was feeling better In the ER she was given antivert  Today she feels like her head is very tired She is taking the medications as prescribed  She reports that she has been under a lot of stress and was diagnosed with vertigo  She reports that if she is sleeping on the left side and turns to the right side she feels the symptoms again She states that she    Past Medical History:  Diagnosis Date  . No pertinent past medical history   . Postpartum care and examination of lactating mother 11/20/2010  . Postpartum care following vaginal delivery (4/22) 08/31/2014    Current Outpatient Prescriptions  Medication Sig Dispense Refill  . LORazepam (ATIVAN) 1 MG tablet Take 1 tablet (1 mg total) by mouth 3 (three) times daily as needed (dizziness / nausea). 15 tablet 0  . meclizine (ANTIVERT) 25 MG tablet Take 1 tablet (25 mg total) by mouth 3 (three) times daily. 21 tablet 0  . escitalopram (LEXAPRO) 10 MG tablet Take 1 tablet (10 mg total) by mouth daily. 30 tablet 6   No current facility-administered medications for this visit.     Allergies: No Known Allergies  Past Surgical History:  Procedure Laterality Date  . CERVICAL CERCLAGE    . CERVICAL CERCLAGE N/A 02/21/2014   Procedure: Modified McDonald CERVICAL CERCLAGE ;  Surgeon: Genia DelMarie-Lyne Lavoie, MD;  Location: [facility];  Service: Gynecology;  Laterality: N/A;  EDD: 09/05/14  . HEMORRHOID SURGERY     after delivery  . LAPAROSCOPIC TUBAL LIGATION Bilateral 09/11/2014   Procedure: LAPAROSCOPIC TUBAL LIGATION With Cautery;  Surgeon: Genia DelMarie-Lyne Lavoie, MD;  Location: [facility];  Service: Gynecology;   Laterality: Bilateral;    Social History   Social History  . Marital status: Married    Spouse name: N/A  . Number of children: N/A  . Years of education: N/A   Social History Main Topics  . Smoking status: Never Smoker  . Smokeless tobacco: Never Used  . Alcohol use No  . Drug use: No  . Sexual activity: Yes   Other Topics Concern  . Not on file   Social History Narrative  . No narrative on file    ROS  Objective: Vitals:   10/30/16 1154  BP: 138/85  Pulse: 81  Resp: 16  Temp: 98.6 F (37 C)  TempSrc: Oral  SpO2: 97%  Weight: 205 lb 12.8 oz (93.4 kg)  Height: 5\' 7"  (1.702 m)    Physical Exam  Constitutional: She is oriented to person, place, and time. She appears well-developed and well-nourished.  HENT:  Head: Normocephalic and atraumatic.  Right Ear: External ear normal.  Left Ear: External ear normal.  Normal fundoscopic exam   Eyes: Conjunctivae and EOM are normal.  Neck: Normal range of motion. Neck supple.  Cardiovascular: Normal rate, regular rhythm and normal heart sounds.   Pulmonary/Chest: Effort normal and breath sounds normal. No respiratory distress. She has no wheezes.  Neurological: She is alert and oriented to person, place, and time.  Psychiatric: She has a normal mood and affect. Her behavior is normal. Judgment and  thought content normal.    IMPRESSION: 1. No acute or focal lesion to explain the patient's acute onset of vertigo and headache. 2. Normal CTA circle of Willis. 3. Mild left maxillary and ethmoid sinus disease has developed.   Electronically Signed   By: Marin Roberts M.D.   On: 10/28/2016 11:21   Assessment and Plan Willow was seen today for follow-up.  Diagnoses and all orders for this visit:  Dizziness Vertigo Advised pt to follow up with PT for vestibular rehabilitation -     Ambulatory referral to Physical Therapy  Adjustment disorder with mixed anxiety and depressed mood Discussed that  cessation of therapy too soon can lead to rebound mood changes Reordered lexapro -     escitalopram (LEXAPRO) 10 MG tablet; Take 1 tablet (10 mg total) by mouth daily.     Jazarah Capili A Iyari Hagner

## 2016-11-25 ENCOUNTER — Encounter: Payer: Self-pay | Admitting: Family Medicine

## 2016-11-25 DIAGNOSIS — H8111 Benign paroxysmal vertigo, right ear: Secondary | ICD-10-CM | POA: Insufficient documentation

## 2017-01-26 ENCOUNTER — Ambulatory Visit (INDEPENDENT_AMBULATORY_CARE_PROVIDER_SITE_OTHER): Payer: Commercial Managed Care - PPO | Admitting: Family Medicine

## 2017-01-26 ENCOUNTER — Encounter: Payer: Self-pay | Admitting: Family Medicine

## 2017-01-26 ENCOUNTER — Other Ambulatory Visit: Payer: Self-pay | Admitting: Surgery

## 2017-01-26 VITALS — BP 132/78 | HR 89 | Temp 98.3°F | Resp 18 | Ht 67.32 in | Wt 208.8 lb

## 2017-01-26 DIAGNOSIS — R51 Headache: Secondary | ICD-10-CM

## 2017-01-26 DIAGNOSIS — R519 Headache, unspecified: Secondary | ICD-10-CM

## 2017-01-26 DIAGNOSIS — L918 Other hypertrophic disorders of the skin: Secondary | ICD-10-CM | POA: Diagnosis not present

## 2017-01-26 DIAGNOSIS — R42 Dizziness and giddiness: Secondary | ICD-10-CM

## 2017-01-26 MED ORDER — LORAZEPAM 1 MG PO TABS: 1.0000 mg | ORAL_TABLET | Freq: Three times a day (TID) | ORAL | 0 refills | Status: DC | PRN

## 2017-01-26 MED ORDER — MECLIZINE HCL 25 MG PO TABS
25.0000 mg | ORAL_TABLET | Freq: Three times a day (TID) | ORAL | 0 refills | Status: DC | PRN
Start: 2017-01-26 — End: 2017-02-04

## 2017-01-26 NOTE — Patient Instructions (Addendum)
Meclizine 3 times per day as needed for vertigo.  If needed, I did write for a few Ativan, but initially start with meclizine. Be careful combining medications as both can cause sedation. Return to the clinic or go to the nearest emergency room if any of your symptoms worsen or new symptoms occur.   See other info below and exercises to help with vertigo. Follow up with Dr. Creta Levin in 1 week to see if physical therapy will be needed again.  She can also look at the skin tags on your neck to decide on appropriate treatment. Return to the clinic or go to the nearest emergency room if any of your symptoms worsen or new symptoms occur.  Maniobra de Energy manager, cuidado personal Merchandiser, retail Self-Care) QU ES LA MANIOBRA DE EPLEY? La [employer] de [employer] es un ejercicio que puede Education officer, environmental para Paramedic los sntomas del vrtigo posicional paroxstico benigno (VPPB). Esta afeccin a menudo se conoce como vrtigo. El movimiento de unos pequeos cristales (canalculos) dentro del odo interno ocasiona el VPPB. La acumulacin y el movimiento de los canalculos en el odo interno ocasionan una repentina sensacin de aceleracin (vrtigo) cuando se mueve la cabeza en ciertas posiciones. El vrtigo por lo general dura unos 30das. El VPPB normalmente ocurre solo en un odo. Si siente vrtigo cuando se recuesta sobre el lado izquierdo, probablemente tenga VPPB en el odo izquierdo. El mdico le puede decir qu odo est involucrado. Una lesin en la cabeza puede ocasionar el VPPB. Muchas personas de ms de 50aos tienen VPPB por motivos desconocidos. Si se le diagnostic VPPB, el mdico puede ensearle a Firefighter. El VPPB no es potencialmente mortal (benigno) y normalmente se pasa con el tiempo. CUNDO DEBO REALIZAR LA MANIOBRA DE EPLEY? Puede realizar [employer] en su casa cuando tenga los sntomas de vrtigo. Puede realizar Musician de Epley hasta 3veces en un da hasta que los sntomas de  vrtigo desaparezcan. CMO DEBO REALIZAR LA MANIOBRA DE EPLEY? 1. Sintese en el borde de una cama o una mesa con la espalda recta. Las piernas deben estar extendidas o colgando sobre el borde de la cama o la mesa. 2. Gire la cabeza a medias hacia el lado del odo afectado. 3. Recustese hacia atrs con la cabeza girada hasta que se encuentre recostado sobre la espalda. Quizs quiera colocar una almohada debajo de los hombros. 4. Mantenga esta posicin durante 30segundos. Es posible que experimente un ataque de vrtigo. Esto es normal. Mantenga esta posicin hasta que el vrtigo desaparezca. 5. Luego gire la cabeza en direccin opuesta hasta que el odo no afectado est orientado al suelo. 6. Mantenga esta posicin durante 30segundos. Es posible que experimente un ataque de vrtigo. Esto es normal. Mantenga esta posicin hasta que el vrtigo desaparezca. 7. Ahora gire todo el cuerpo hacia el mismo lado que la cabeza. Mantenga esta posicin durante otros 30segundos. 8. Luego, vuelva a sentarse.  ESTA MANIOBRA PRESENTA RIESGOS? En algunos casos, puede tener otros sntomas (como cambios en la visin, debilidad o entumecimiento). Si tiene estos sntomas, deje de Clinical cytogeneticist y llame al mdico. Baron Sane si realizar esta maniobra lo [place] del vrtigo, es posible que sienta mareos. El mareo es la sensacin de desvanecimiento pero sin la sensacin de [place]. Aunque la [employer] de Energy manager lo [place] del vrtigo, es posible que los sntomas vuelvan durante los siguientes 5aos. QU DEBO HACER DESPUS DE ESTA MANIOBRA? Puede retomar sus actividades normales despus de Education officer, environmental la [place] de Energy manager. Pregntele al  mdico si debe hacer algo en su casa para prevenir el vrtigo. Esta puede incluir:  Dormir con dos o ms almohadas para Pharmacologist la cabeza elevada.  No dormir sobre el lado del odo afectado.  Levantarse lentamente de la cama.  Evitar los movimientos repentinos Product manager.  Evitar los movimientos de cabeza intensos, como mirar hacia arriba o Public librarian.  Utilizar un collar cervical para evitar los movimientos de cabeza repentinos. QU DEBO HACER SI LOS SNTOMAS EMPEORAN? Llame al mdico si el vrtigo empeora. Llame al mdico inmediatamente si tiene otros sntomas, incluidos:  Nuseas.  Vmitos.  Dolor de [place].  Debilidad.  Entumecimiento.  Cambios en la visin. Esta informacin no tiene Theme park manager el consejo del mdico. Asegrese de hacerle al mdico cualquier pregunta que tenga. Document Released: 05/02/2013 Document Revised: 05/02/2013 Document Reviewed: 03/21/2013 Elsevier Interactive Patient Education  2017 Elsevier Inc.  [place] (Dizziness) Los mareos son un problema muy frecuente. Se trata de una sensacin de inestabilidad o de desvanecimiento. Puede sentir que se va a desmayar. Un mareo puede provocarle una lesin si se tropieza o se cae. Las Dealer de todas las edades pueden sufrir Research scientist (life sciences), Biomedical engineer es ms frecuente en los adultos [place]. Esta afeccin puede tener muchas causas, entre las que se pueden [employer], la deshidratacin y [place]. INSTRUCCIONES PARA EL CUIDADO EN EL HOGAR Estas indicaciones pueden ayudarlo con el trastorno: Comida y bebida  Beba suficiente lquido para Pharmacologist la orina clara o de color amarillo plido. Esto evita la deshidratacin. Trate de beber ms lquidos transparentes, como agua.  No beba alcohol.  Limite el consumo de cafena si el mdico se lo indica.  Limite el consumo de sal si el mdico se lo indica. Actividad  Evite los movimientos rpidos. ? Levntese de las sillas con lentitud y apyese hasta sentirse bien. ? Por la maana, sintese primero a un lado de la cama. Cuando se sienta bien, pngase lentamente de [address] se sostiene de algo, hasta que sepa que ha logrado el equilibrio.  Mueva las piernas con frecuencia si debe estar de pie en un lugar  durante mucho tiempo. Mientras est de pie, contraiga y relaje los msculos de las piernas.  No conduzca vehculos ni opere maquinaria pesada si se siente mareado.  Evite agacharse si se siente mareado. En su casa, coloque los objetos de modo que le resulte fcil alcanzarlos sin Public librarian. Estilo de vida  No consuma ningn producto que contenga tabaco, lo que incluye cigarrillos, tabaco de Theatre manager o Administrator, Civil Service. Si necesita ayuda para dejar de fumar, consulte al [employer].  Trate de reducir el nivel de estrs practicando actividades como el yoga o la meditacin. Hable con el mdico si necesita ayuda. Instrucciones generales  Controle sus mareos para ver si hay cambios.  Tome los medicamentos solamente como se lo haya indicado el mdico. Hable con el mdico si cree que los medicamentos que est tomando son la causa de sus mareos.  Infrmele a un amigo o a un familiar si se siente mareado. Pdale a esta persona que llame al mdico si observa cambios en su comportamiento.  Concurra a todas las visitas de control como se lo haya indicado el mdico. Esto es importante. SOLICITE ATENCIN MDICA SI:  Los [employer].  Los [employer] o la sensacin de Production assistant, radio.  Siente nuseas.  Ha perdido la audicin.  Aparecen nuevos sntomas.  Cuando est de pie se siente inestable o que la habitacin da vueltas.  SOLICITE ATENCIN  MDICA DE INMEDIATO SI:  Vomita o tiene diarrea y no puede comer ni beber nada.  Tiene dificultad para hablar, para caminar, para tragar o para [employer], las manos o las piernas.  Siente una debilidad generalizada.  No piensa con claridad o tiene dificultades para armar oraciones. Es posible que un amigo o un familiar adviertan que esto ocurre.  Tiene dolor de pecho, dolor abdominal, sudoracin o Company secretary.  Hay cambios en la visin.  Observa un sangrado.  Tiene dolores de [place].  Tiene dolor o rigidez en el  cuello.  Tiene fiebre.  Esta informacin no tiene Theme park manager el consejo del mdico. Asegrese de hacerle al mdico cualquier pregunta que tenga. Document Released: 04/27/2005 Document Revised: 09/11/2014 Document Reviewed: 04/23/2014 Elsevier Interactive Patient Education  2017 [employer].    IF you received an x-ray today, you will receive an invoice from [facility]. Please contact [facility] at [phone] with questions or concerns regarding your invoice.   IF you received labwork today, you will receive an invoice from [facility]. Please contact [facility] at [phone] with questions or concerns regarding your invoice.   Our billing staff will not be able to assist you with questions regarding bills from these companies.  You will be contacted with the lab results as soon as they are available. The fastest way to get your results is to activate your My Chart account. Instructions are located on the last page of this paperwork. If you have not heard from [place] regarding the results in 2 weeks, please contact this office.

## 2017-01-26 NOTE — Progress Notes (Signed)
Subjective:  By signing my name below, I, Essence Howell, attest that this documentation has been prepared under the direction and in the presence of Shade Flood, MD Electronically Signed: Charline Bills, ED Scribe 01/26/2017 at 1:48 PM.   Patient ID: [name], female    DOB: 22-Feb-1976, 41 y.o.   MRN: [id number]  Chief Complaint  Patient presents with  . Medication Refill    Lorazepam  . Dizziness    X 3-4 days- with headache   HPI [name] is a 41 y.o. female who presents to Primary Care at [facility] complaining of HA x 3-4 days. PCP: Doristine Bosworth, MD. Appears she was seen for HA in February, intermittent dizziness, hand numbness. Suspected migraine but had a CT of head and cervical spine ordered. Negative CT, very mild cervical spondylosis. Seen again March 9 with concerns regarding new changes in memory. Thought to have adjustment disorder with anxiety and depression, chronic fatigue. Started on Lexapro 10 mg qd. Normal TSH, B12, Vitamin D was low at 17. Recommended 2000 units of Vitamin D OTC. She was seen in the ER June 20 due to severe HA with vomiting. CT angio negative for mass, hemorrhage or aneurysm. Suspected migraine that improved during ER visit. She was treated with Zofran, compazine, decadron, ativan. Seen in follow-up June 22 by Dr. Creta Levin. She had stopped Lexapro. Diagnosed with vertigo, referred to PT for vestibular rehab. Restarted Lexapro. Over 10 minutes of chart review initially.   Pt states that she did go to PT which resolved dizziness but states symptoms returned 4 days ago. She reports HA that she describes as heaviness, dizziness that is worse in the morning, nausea and vomiting. States symptoms feel the same as previous vertigo. Denies weakness in extremities, difficulty speaking.   Patient Active Problem List   Diagnosis Date Noted  . BPPV (benign paroxysmal positional vertigo), right 11/25/2016  . Adjustment disorder with  mixed anxiety and depressed mood 07/20/2016  . Chronic fatigue 07/20/2016   Past Medical History:  Diagnosis Date  . No pertinent past medical history   . Postpartum care and examination of lactating mother 11/20/2010  . Postpartum care following vaginal delivery (4/22) 08/31/2014   Past Surgical History:  Procedure Laterality Date  . CERVICAL CERCLAGE    . CERVICAL CERCLAGE N/A 02/21/2014   Procedure: Modified McDonald CERVICAL CERCLAGE ;  Surgeon: Genia Del, MD;  Location: [facility];  Service: Gynecology;  Laterality: N/A;  EDD: 09/05/14  . HEMORRHOID SURGERY     after delivery  . LAPAROSCOPIC TUBAL LIGATION Bilateral 09/11/2014   Procedure: LAPAROSCOPIC TUBAL LIGATION With Cautery;  Surgeon: Genia Del, MD;  Location: [facility];  Service: Gynecology;  Laterality: Bilateral;   No Known Allergies Prior to Admission medications   Medication Sig Start Date End Date Taking? Authorizing Provider  escitalopram (LEXAPRO) 10 MG tablet Take 1 tablet (10 mg total) by mouth daily. 10/30/16  Yes Stallings, Zoe A, MD  LORazepam (ATIVAN) 1 MG tablet Take 1 tablet (1 mg total) by mouth 3 (three) times daily as needed (dizziness / nausea). 10/28/16  Yes Gerhard Munch, MD   Social History   Social History  . Marital status: Married    Spouse name: N/A  . Number of children: N/A  . Years of education: N/A   Occupational History  . Not on file.   Social History Main Topics  . Smoking status: Never Smoker  . Smokeless tobacco: Never Used  . Alcohol use Yes  Comment: occ  . Drug use: No  . Sexual activity: Yes   Other Topics Concern  . Not on file   Social History Narrative  . No narrative on file   Review of Systems  Gastrointestinal: Positive for nausea and vomiting.  Neurological: Positive for dizziness and headaches. Negative for speech difficulty and weakness.      Objective:   Physical Exam  Constitutional: She is oriented to person, place, and time. She appears  well-developed and well-nourished.  HENT:  Head: Normocephalic and atraumatic.  Eyes: Pupils are equal, round, and reactive to light. Conjunctivae and EOM are normal.  horizontal nystagmus, fast beat to the L with looking L  Neck: Carotid bruit is not present.  Cardiovascular: Normal rate, regular rhythm, normal heart sounds and intact distal pulses.   Pulmonary/Chest: Effort normal and breath sounds normal.  Neurological: She is alert and oriented to person, place, and time. She displays a negative Romberg sign.  Non-focal neuro exam. Equal grip strength bilaterally. No facial droop. No pronator drift.  Skin: Skin is warm and dry.  Psychiatric: She has a normal mood and affect. Her behavior is normal.  Vitals reviewed.  Vitals:   01/26/17 1323  BP: 132/78  Pulse: 89  Resp: 18  Temp: 98.3 F (36.8 C)  TempSrc: Oral  SpO2: 98%  Weight: 208 lb 12.8 oz (94.7 kg)  Height: 5' 7.32" (1.71 m)      Assessment & Plan:    [name] is a 41 y.o. female Vertigo - Plan: meclizine (ANTIVERT) 25 MG tablet, LORazepam (ATIVAN) 1 MG tablet Nonintractable headache, unspecified chronicity pattern, unspecified headache type  - Recurrence of typical symptoms of vertigo from her previous episodes. Nonfocal neurologic exam. Slight headache, previous imaging reviewed as above.  -Restart meclizine 25 mg 3 times a day when necessary, short course of Ativan if needed only if meclizine is not helping with vertigo symptoms. Potential additive side effects were discussed.  -Home Epley maneuver handout given, follow-up with primary care provider in one week to decide if repeat physical therapy for vestibular rehabilitation as indicated.  -ER/RTC precautions discussed if worsening.  Skin tags, multiple acquired   -Multiple small Skin tags on neck line. Can discuss with primary care provider next visit for cryotherapy versus other removal.   Meds ordered this encounter  Medications  .  meclizine (ANTIVERT) 25 MG tablet    Sig: Take 1 tablet (25 mg total) by mouth 3 (three) times daily as needed for dizziness.    Dispense:  30 tablet    Refill:  0  . LORazepam (ATIVAN) 1 MG tablet    Sig: Take 1 tablet (1 mg total) by mouth 3 (three) times daily as needed (dizziness / nausea).    Dispense:  10 tablet    Refill:  0   Patient Instructions    Meclizine 3 times per day as needed for vertigo.  If needed, I did write for a few Ativan, but initially start with meclizine. Be careful combining medications as both can cause sedation. Return to the clinic or go to the nearest emergency room if any of your symptoms worsen or new symptoms occur.   See other info below and exercises to help with vertigo. Follow up with Dr. Creta Levin in 1 week to see if physical therapy will be needed again.  She can also look at the skin tags on your neck to decide on appropriate treatment. Return to the clinic or go to the nearest  emergency room if any of your symptoms worsen or new symptoms occur.  Maniobra de Energy manager, cuidado personal Merchandiser, retail Self-Care) QU ES LA MANIOBRA DE EPLEY? La [employer] de Epley es un ejercicio que puede Education officer, environmental para Paramedic los sntomas del vrtigo posicional paroxstico benigno (VPPB). Esta afeccin a menudo se conoce como vrtigo. El movimiento de unos pequeos cristales (canalculos) dentro del odo interno ocasiona el VPPB. La acumulacin y el movimiento de los canalculos en el odo interno ocasionan una repentina sensacin de aceleracin (vrtigo) cuando se mueve la cabeza en ciertas posiciones. El vrtigo por lo general dura unos 30das. El VPPB normalmente ocurre solo en un odo. Si siente vrtigo cuando se recuesta sobre el lado izquierdo, probablemente tenga VPPB en el odo izquierdo. El mdico le puede decir qu odo est involucrado. Una lesin en la cabeza puede ocasionar el VPPB. Muchas personas de ms de 50aos tienen VPPB por motivos desconocidos. Si se  le diagnostic VPPB, el mdico puede ensearle a Firefighter. El VPPB no es potencialmente mortal (benigno) y normalmente se pasa con el tiempo. CUNDO DEBO REALIZAR LA MANIOBRA DE EPLEY? Puede realizar [employer] en su casa cuando tenga los sntomas de vrtigo. Puede realizar Musician de Epley hasta 3veces en un da hasta que los sntomas de vrtigo desaparezcan. CMO DEBO REALIZAR LA MANIOBRA DE EPLEY? 1. Sintese en el borde de una cama o una mesa con la espalda recta. Las piernas deben estar extendidas o colgando sobre el borde de la cama o la mesa. 2. Gire la cabeza a medias hacia el lado del odo afectado. 3. Recustese hacia atrs con la cabeza girada hasta que se encuentre recostado sobre la espalda. Quizs quiera colocar una almohada debajo de los hombros. 4. Mantenga esta posicin durante 30segundos. Es posible que experimente un ataque de vrtigo. Esto es normal. Mantenga esta posicin hasta que el vrtigo desaparezca. 5. Luego gire la cabeza en direccin opuesta hasta que el odo no afectado est orientado al suelo. 6. Mantenga esta posicin durante 30segundos. Es posible que experimente un ataque de vrtigo. Esto es normal. Mantenga esta posicin hasta que el vrtigo desaparezca. 7. Ahora gire todo el cuerpo hacia el mismo lado que la cabeza. Mantenga esta posicin durante otros 30segundos. 8. Luego, vuelva a sentarse.  ESTA MANIOBRA PRESENTA RIESGOS? En algunos casos, puede tener otros sntomas (como cambios en la visin, debilidad o entumecimiento). Si tiene estos sntomas, deje de Clinical cytogeneticist y llame al mdico. Baron Sane si realizar esta maniobra lo [place] del vrtigo, es posible que sienta mareos. El mareo es la sensacin de desvanecimiento pero sin la sensacin de [place]. Aunque la [employer] de Energy manager lo [place] del vrtigo, es posible que los sntomas vuelvan durante los siguientes 5aos. QU DEBO HACER DESPUS DE ESTA MANIOBRA? Puede retomar sus  actividades normales despus de Education officer, environmental la [place] de Energy manager. Pregntele al mdico si debe hacer algo en su casa para prevenir el vrtigo. Esta puede incluir:  Dormir con dos o ms almohadas para Pharmacologist la cabeza elevada.  No dormir sobre el lado del odo afectado.  Levantarse lentamente de la cama.  Evitar los movimientos repentinos Administrator.  Evitar los movimientos de cabeza intensos, como mirar hacia arriba o Public librarian.  Utilizar un collar cervical para evitar los movimientos de cabeza repentinos. QU DEBO HACER SI LOS SNTOMAS EMPEORAN? Llame al mdico si el vrtigo empeora. Llame al mdico inmediatamente si tiene otros sntomas, incluidos:  Nuseas.  Vmitos.  Dolor de [place].  Debilidad.  Entumecimiento.  Cambios en la visin. Esta informacin no tiene Theme park manager el consejo del mdico. Asegrese de hacerle al mdico cualquier pregunta que tenga. Document Released: 05/02/2013 Document Revised: 05/02/2013 Document Reviewed: 03/21/2013 Elsevier Interactive Patient Education  2017 Elsevier Inc.  [place] (Dizziness) Los mareos son un problema muy frecuente. Se trata de una sensacin de inestabilidad o de desvanecimiento. Puede sentir que se va a desmayar. Un mareo puede provocarle una lesin si se tropieza o se cae. Las Dealer de todas las edades pueden sufrir Research scientist (life sciences), Biomedical engineer es ms frecuente en los adultos [place]. Esta afeccin puede tener muchas causas, entre las que se pueden Assurant, la deshidratacin y [place]. INSTRUCCIONES PARA EL CUIDADO EN EL HOGAR Estas indicaciones pueden ayudarlo con el trastorno: Comida y bebida  Beba suficiente lquido para Pharmacologist la orina clara o de color amarillo plido. Esto evita la deshidratacin. Trate de beber ms lquidos transparentes, como agua.  No beba alcohol.  Limite el consumo de cafena si el mdico se lo indica.  Limite el consumo de sal si el mdico se lo  indica. Actividad  Evite los movimientos rpidos. ? Levntese de las sillas con lentitud y apyese hasta sentirse bien. ? Por la maana, sintese primero a un lado de la cama. Cuando se sienta bien, pngase lentamente de [address] se sostiene de algo, hasta que sepa que ha logrado el equilibrio.  Mueva las piernas con frecuencia si debe estar de pie en un lugar durante mucho tiempo. Mientras est de pie, contraiga y relaje los msculos de las piernas.  No conduzca vehculos ni opere maquinaria pesada si se siente mareado.  Evite agacharse si se siente mareado. En su casa, coloque los objetos de modo que le resulte fcil alcanzarlos sin Public librarian. Estilo de vida  No consuma ningn producto que contenga tabaco, lo que incluye cigarrillos, tabaco de Theatre manager o Administrator, Civil Service. Si necesita ayuda para dejar de fumar, consulte al [employer].  Trate de reducir el nivel de estrs practicando actividades como el yoga o la meditacin. Hable con el mdico si necesita ayuda. Instrucciones generales  Controle sus mareos para ver si hay cambios.  Tome los medicamentos solamente como se lo haya indicado el mdico. Hable con el mdico si cree que los medicamentos que est tomando son la causa de sus mareos.  Infrmele a un amigo o a un familiar si se siente mareado. Pdale a esta persona que llame al mdico si observa cambios en su comportamiento.  Concurra a todas las visitas de control como se lo haya indicado el mdico. Esto es importante. SOLICITE ATENCIN MDICA SI:  Los [employer].  Los [employer] o la sensacin de Production assistant, radio.  Siente nuseas.  Ha perdido la audicin.  Aparecen nuevos sntomas.  Cuando est de pie se siente inestable o que la habitacin da vueltas.  SOLICITE ATENCIN MDICA DE INMEDIATO SI:  Vomita o tiene diarrea y no puede comer ni beber nada.  Tiene dificultad para hablar, para caminar, para tragar o para [employer], las manos o las  piernas.  Siente una debilidad generalizada.  No piensa con claridad o tiene dificultades para armar oraciones. Es posible que un amigo o un familiar adviertan que esto ocurre.  Tiene dolor de pecho, dolor abdominal, sudoracin o Company secretary.  Hay cambios en la visin.  Observa un sangrado.  Tiene dolores de [place].  Tiene dolor o rigidez en el cuello.  Tiene fiebre.  Esta informacin no tiene como fin  reemplazar el consejo del mdico. Asegrese de hacerle al mdico cualquier pregunta que tenga. Document Released: 04/27/2005 Document Revised: 09/11/2014 Document Reviewed: 04/23/2014 Elsevier Interactive Patient Education  2017 [employer].    IF you received an x-ray today, you will receive an invoice from [facility]. Please contact [facility] at [phone] with questions or concerns regarding your invoice.   IF you received labwork today, you will receive an invoice from [facility]. Please contact [facility] at [phone] with questions or concerns regarding your invoice.   Our billing staff will not be able to assist you with questions regarding bills from these companies.  You will be contacted with the lab results as soon as they are available. The fastest way to get your results is to activate your My Chart account. Instructions are located on the last page of this paperwork. If you have not heard from [place] regarding the results in 2 weeks, please contact this office.      I personally performed the services described in this documentation, which was scribed in my presence. The recorded information has been reviewed and considered for accuracy and completeness, addended by me as needed, and agree with information above.  Signed,   Meredith Staggers, MD Primary Care at [facility].  01/26/17 2:03 PM

## 2017-01-27 ENCOUNTER — Encounter (HOSPITAL_BASED_OUTPATIENT_CLINIC_OR_DEPARTMENT_OTHER): Payer: Self-pay | Admitting: *Deleted

## 2017-01-27 NOTE — H&P (Signed)
[name] [name] 01/26/2017 3:43 PM Location: [facility] Patient #: [id number] DOB: [birth year] Married / Language: English / Race: Undefined Female   History of Present Illness (Stacey Texeira A. Magnus Ivan MD; 01/26/2017 3:56 PM) Patient words: hems.  The patient is a 41 year old female who presents with hemorrhoids. This patient is a self-referral for problem with her hemorrhoids. She actually had a hemorrhoidectomy by Dr. Earlene Wu in 2007. This was a single column posterior midline hemorrhoidectomy. She reports that since then she is still had problems with prolapsing hemorrhoids and some discomfort and hygiene issues. It is being worse over the past several years. She has minimal discomfort and no bleeding. She has no issues with incontinence   Diagnostic Studies History [name], [name]; 01/26/2017 3:43 PM) Colonoscopy  never Pap Smear  never  Allergies [name], [name]; 01/26/2017 3:44 PM) No Known Drug Allergies 01/26/2017  Medication History [name], [name]; 01/26/2017 3:44 PM) No Current Medications Medications Reconciled  Social History [name], [name]; 01/26/2017 3:43 PM) Alcohol use  Occasional alcohol use. Caffeine use  Coffee. No drug use  Tobacco use  Never smoker.  Family History [name], [place]; 01/26/2017 3:43 PM) Family history unknown  First Degree Relatives   Pregnancy / Birth History [name], [name]; 01/26/2017 3:43 PM) [name]  7 Length (months) of breastfeeding  12-24 Maternal age  81-25 Para  3 Regular periods   Other Problems [name], [name]; 01/26/2017 3:43 PM) No pertinent past medical history     Review of Systems [name] [name]; 01/26/2017 3:43 PM) General Not Present- Appetite Loss, Chills, Fatigue, Fever, Night Sweats, Weight Gain and Weight Loss. Skin Present- Dryness. Not Present- Change in Wart/Mole, Hives, Jaundice, New Lesions, Non-Healing Wounds, Rash and Ulcer. HEENT Not Present-  Earache, Hearing Loss, Hoarseness, Nose Bleed, Oral Ulcers, Ringing in the Ears, Seasonal Allergies, Sinus Pain, Sore Throat, Visual Disturbances, Wears glasses/contact lenses and Yellow Eyes. Respiratory Not Present- Bloody sputum, Chronic Cough, Difficulty Breathing, Snoring and Wheezing. Breast Not Present- Breast Mass, Breast Pain, Nipple Discharge and Skin Changes. Cardiovascular Not Present- Chest Pain, Difficulty Breathing Lying Down, Leg Cramps, Palpitations, Rapid Heart Rate, Shortness of Breath and Swelling of Extremities. Gastrointestinal Not Present- Abdominal Pain, Bloating, Bloody Stool, Change in Bowel Habits, Chronic diarrhea, Constipation, Difficulty Swallowing, Excessive gas, Gets full quickly at meals, Hemorrhoids, Indigestion, Nausea, Rectal Pain and Vomiting. Female Genitourinary Not Present- Frequency, Nocturia, Painful Urination, Pelvic Pain and Urgency. Musculoskeletal Not Present- Back Pain, Joint Pain, Joint Stiffness, Muscle Pain, Muscle Weakness and Swelling of Extremities. Neurological Not Present- Decreased Memory, Fainting, Headaches, Numbness, Seizures, Tingling, Tremor, Trouble walking and Weakness. Psychiatric Present- Depression. Not Present- Anxiety, Bipolar, Change in Sleep Pattern, Fearful and Frequent crying. Endocrine Not Present- Cold Intolerance, Excessive Hunger, Hair Changes, Heat Intolerance, Hot flashes and New Diabetes. Hematology Not Present- Blood Thinners, Easy Bruising, Excessive bleeding, Gland problems, HIV and Persistent Infections.  Vitals ([name]; 01/26/2017 3:44 PM) 01/26/2017 3:43 PM Weight: 209.2 lb Height: 68in Body Surface Area: 2.08 m Body Mass Index: 31.81 kg/m  Temp.: 98.24F(Oral)  Pulse: 99 (Regular)  BP: 120/78 (Sitting, Left Arm, Standard)       Physical Exam ([name] A. Magnus Ivan MD; 01/26/2017 3:57 PM) The physical exam findings are as follows: Note:On examination, she has a large external  hemorrhoidal skin column at the anterior midline with some large skin tagging as well. She had no other abnormalities and her endoscopy was fairly unremarkable Generally she is well in appearance Lungs  clear CV RRR Abdomen soft, NT/ND Skin without rash Ext with no edema    Assessment & Plan (Stacey Plack A. 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 I do not believe this will resolve with just external steroids or suppositories. I believe she needs excision of the hemorrhoidal column. I discussed this with her detail. We discussed the risks of surgery which includes but is not limited to bleeding, infection, recurrence, etc. She understands and wished to proceed with surgery which will be scheduled

## 2017-01-28 ENCOUNTER — Ambulatory Visit (HOSPITAL_BASED_OUTPATIENT_CLINIC_OR_DEPARTMENT_OTHER): Payer: Commercial Managed Care - PPO | Admitting: Certified Registered"

## 2017-01-28 ENCOUNTER — Encounter (HOSPITAL_BASED_OUTPATIENT_CLINIC_OR_DEPARTMENT_OTHER): Payer: Self-pay | Admitting: *Deleted

## 2017-01-28 ENCOUNTER — Ambulatory Visit (HOSPITAL_BASED_OUTPATIENT_CLINIC_OR_DEPARTMENT_OTHER)
Admission: RE | Admit: 2017-01-28 | Discharge: 2017-01-28 | Disposition: A | Discharge status: Home / Self Care | Payer: Commercial Managed Care - PPO | Source: Ambulatory Visit | Attending: Surgery | Admitting: Surgery

## 2017-01-28 ENCOUNTER — Encounter (HOSPITAL_BASED_OUTPATIENT_CLINIC_OR_DEPARTMENT_OTHER)
Admission: RE | Disposition: A | Discharge status: Home / Self Care | Payer: Self-pay | Source: Ambulatory Visit | Attending: Surgery

## 2017-01-28 DIAGNOSIS — F419 Anxiety disorder, unspecified: Secondary | ICD-10-CM | POA: Insufficient documentation

## 2017-01-28 DIAGNOSIS — F329 Major depressive disorder, single episode, unspecified: Secondary | ICD-10-CM | POA: Insufficient documentation

## 2017-01-28 DIAGNOSIS — K644 Residual hemorrhoidal skin tags: Secondary | ICD-10-CM | POA: Insufficient documentation

## 2017-01-28 DIAGNOSIS — K648 Other hemorrhoids: Secondary | ICD-10-CM | POA: Insufficient documentation

## 2017-01-28 HISTORY — PX: EVALUATION UNDER ANESTHESIA WITH HEMORRHOIDECTOMY: SHX5624

## 2017-01-28 HISTORY — DX: Depression, unspecified: F32.A

## 2017-01-28 HISTORY — DX: Anxiety disorder, unspecified: F41.9

## 2017-01-28 HISTORY — DX: Major depressive disorder, single episode, unspecified: F32.9

## 2017-01-28 HISTORY — DX: Benign paroxysmal vertigo, unspecified ear: H81.10

## 2017-01-28 HISTORY — DX: Unspecified hemorrhoids: K64.9

## 2017-01-28 SURGERY — EXAM UNDER ANESTHESIA WITH HEMORRHOIDECTOMY
Anesthesia: General | Site: Rectum

## 2017-01-28 MED ORDER — MIDAZOLAM HCL 2 MG/2ML IJ SOLN
INTRAMUSCULAR | Status: AC
  Filled 2017-01-28: qty 2

## 2017-01-28 MED ORDER — ONDANSETRON HCL 4 MG/2ML IJ SOLN
INTRAMUSCULAR | Status: AC
  Filled 2017-01-28: qty 2

## 2017-01-28 MED ORDER — SODIUM CHLORIDE 0.9 % IV SOLN
INTRAVENOUS | Status: DC | PRN
  Administered 2017-01-28: 30 mL

## 2017-01-28 MED ORDER — OXYCODONE HCL 5 MG PO TABS: 5.0000 mg | ORAL_TABLET | Freq: Once | ORAL | Status: DC | PRN

## 2017-01-28 MED ORDER — BUPIVACAINE LIPOSOME 1.3 % IJ SUSP
INTRAMUSCULAR | Status: AC
  Filled 2017-01-28: qty 20

## 2017-01-28 MED ORDER — CHLORHEXIDINE GLUCONATE CLOTH 2 % EX PADS: 6.0000 | MEDICATED_PAD | Freq: Once | CUTANEOUS | Status: DC

## 2017-01-28 MED ORDER — HYDROMORPHONE HCL 1 MG/ML IJ SOLN: 0.2500 mg | INTRAMUSCULAR | Status: DC | PRN

## 2017-01-28 MED ORDER — DEXAMETHASONE SODIUM PHOSPHATE 10 MG/ML IJ SOLN
INTRAMUSCULAR | Status: AC
  Filled 2017-01-28: qty 1

## 2017-01-28 MED ORDER — DEXAMETHASONE SODIUM PHOSPHATE 4 MG/ML IJ SOLN
INTRAMUSCULAR | Status: DC | PRN
  Administered 2017-01-28: 10 mg via INTRAVENOUS

## 2017-01-28 MED ORDER — OXYCODONE-ACETAMINOPHEN 5-325 MG PO TABS: 1.0000 | ORAL_TABLET | ORAL | 0 refills | Status: DC | PRN

## 2017-01-28 MED ORDER — MEPERIDINE HCL 25 MG/ML IJ SOLN: 6.2500 mg | INTRAMUSCULAR | Status: DC | PRN

## 2017-01-28 MED ORDER — LIDOCAINE 5 % EX OINT: 1.0000 "application " | TOPICAL_OINTMENT | CUTANEOUS | 0 refills | Status: DC | PRN

## 2017-01-28 MED ORDER — FENTANYL CITRATE (PF) 100 MCG/2ML IJ SOLN
INTRAMUSCULAR | Status: AC
  Filled 2017-01-28: qty 2

## 2017-01-28 MED ORDER — LACTATED RINGERS IV SOLN
INTRAVENOUS | Status: DC
  Administered 2017-01-28: 09:00:00 via INTRAVENOUS

## 2017-01-28 MED ORDER — ONDANSETRON HCL 4 MG/2ML IJ SOLN
INTRAMUSCULAR | Status: DC | PRN
  Administered 2017-01-28: 4 mg via INTRAVENOUS

## 2017-01-28 MED ORDER — SCOPOLAMINE 1 MG/3DAYS TD PT72: 1.0000 | MEDICATED_PATCH | Freq: Once | TRANSDERMAL | Status: DC | PRN

## 2017-01-28 MED ORDER — PROPOFOL 10 MG/ML IV BOLUS
INTRAVENOUS | Status: AC
  Filled 2017-01-28: qty 20

## 2017-01-28 MED ORDER — LIDOCAINE 2% (20 MG/ML) 5 ML SYRINGE
INTRAMUSCULAR | Status: AC
  Filled 2017-01-28: qty 5

## 2017-01-28 MED ORDER — FENTANYL CITRATE (PF) 100 MCG/2ML IJ SOLN
50.0000 ug | INTRAMUSCULAR | Status: DC | PRN
  Administered 2017-01-28 (×2): 25 ug via INTRAVENOUS

## 2017-01-28 MED ORDER — SCOPOLAMINE 1 MG/3DAYS TD PT72
MEDICATED_PATCH | TRANSDERMAL | Status: AC
  Filled 2017-01-28: qty 1

## 2017-01-28 MED ORDER — CEFAZOLIN SODIUM-DEXTROSE 2-4 GM/100ML-% IV SOLN
2.0000 g | INTRAVENOUS | Status: AC
  Administered 2017-01-28: 2 g via INTRAVENOUS

## 2017-01-28 MED ORDER — PROMETHAZINE HCL 25 MG/ML IJ SOLN: 6.2500 mg | INTRAMUSCULAR | Status: DC | PRN

## 2017-01-28 MED ORDER — KETOROLAC TROMETHAMINE 30 MG/ML IJ SOLN
INTRAMUSCULAR | Status: DC | PRN
  Administered 2017-01-28: 30 mg via INTRAVENOUS

## 2017-01-28 MED ORDER — LIDOCAINE 2% (20 MG/ML) 5 ML SYRINGE
INTRAMUSCULAR | Status: DC | PRN
  Administered 2017-01-28: 80 mg via INTRAVENOUS

## 2017-01-28 MED ORDER — CEFAZOLIN SODIUM-DEXTROSE 2-4 GM/100ML-% IV SOLN
INTRAVENOUS | Status: AC
  Filled 2017-01-28: qty 100

## 2017-01-28 MED ORDER — OXYCODONE HCL 5 MG/5ML PO SOLN: 5.0000 mg | Freq: Once | ORAL | Status: DC | PRN

## 2017-01-28 MED ORDER — KETOROLAC TROMETHAMINE 30 MG/ML IJ SOLN
INTRAMUSCULAR | Status: AC
  Filled 2017-01-28: qty 1

## 2017-01-28 MED ORDER — MIDAZOLAM HCL 2 MG/2ML IJ SOLN
1.0000 mg | INTRAMUSCULAR | Status: DC | PRN
  Administered 2017-01-28: 2 mg via INTRAVENOUS

## 2017-01-28 MED ORDER — PROPOFOL 10 MG/ML IV BOLUS
INTRAVENOUS | Status: DC | PRN
  Administered 2017-01-28: 200 mg via INTRAVENOUS

## 2017-01-28 SURGICAL SUPPLY — 37 items
BLADE SURG 15 STRL LF DISP TIS (BLADE) ×1 IMPLANT
BLADE SURG 15 STRL SS (BLADE) ×2
BRIEF STRETCH FOR OB PAD LRG (UNDERPADS AND DIAPERS) IMPLANT
CANISTER SUCT 1200ML W/VALVE (MISCELLANEOUS) ×3 IMPLANT
COVER MAYO STAND STRL (DRAPES) IMPLANT
DECANTER SPIKE VIAL GLASS SM (MISCELLANEOUS) ×3 IMPLANT
DRAPE UTILITY XL STRL (DRAPES) ×3 IMPLANT
DRSG PAD ABDOMINAL 8X10 ST (GAUZE/BANDAGES/DRESSINGS) ×3 IMPLANT
ELECT REM PT RETURN 9FT ADLT (ELECTROSURGICAL) ×3
ELECTRODE REM PT RTRN 9FT ADLT (ELECTROSURGICAL) ×1 IMPLANT
GAUZE SPONGE 4X4 12PLY STRL LF (GAUZE/BANDAGES/DRESSINGS) ×3 IMPLANT
GLOVE SURG SIGNA 7.5 PF LTX (GLOVE) ×3 IMPLANT
GOWN STRL REUS W/ TWL LRG LVL3 (GOWN DISPOSABLE) ×1 IMPLANT
GOWN STRL REUS W/ TWL XL LVL3 (GOWN DISPOSABLE) ×1 IMPLANT
GOWN STRL REUS W/TWL LRG LVL3 (GOWN DISPOSABLE) ×2
GOWN STRL REUS W/TWL XL LVL3 (GOWN DISPOSABLE) ×2
NEEDLE HYPO 25X1 1.5 SAFETY (NEEDLE) ×3 IMPLANT
PACK BASIN DAY SURGERY FS (CUSTOM PROCEDURE TRAY) ×3 IMPLANT
PACK LITHOTOMY IV (CUSTOM PROCEDURE TRAY) ×3 IMPLANT
PENCIL BUTTON HOLSTER BLD 10FT (ELECTRODE) ×3 IMPLANT
SHEARS HARMONIC 9CM CVD (BLADE) ×3 IMPLANT
SHEET MEDIUM DRAPE 40X70 STRL (DRAPES) ×3 IMPLANT
SLEEVE SCD COMPRESS KNEE MED (MISCELLANEOUS) ×3 IMPLANT
SPONGE SURGIFOAM ABS GEL 100 (HEMOSTASIS) IMPLANT
SPONGE SURGIFOAM ABS GEL 12-7 (HEMOSTASIS) IMPLANT
SURGILUBE 2OZ TUBE FLIPTOP (MISCELLANEOUS) ×3 IMPLANT
SUT CHROMIC 2 0 SH (SUTURE) IMPLANT
SUT CHROMIC 3 0 SH 27 (SUTURE) IMPLANT
SYR CONTROL 10ML LL (SYRINGE) ×3 IMPLANT
TOWEL OR 17X24 6PK STRL BLUE (TOWEL DISPOSABLE) ×3 IMPLANT
TOWEL OR NON WOVEN STRL DISP B (DISPOSABLE) IMPLANT
TRAY DSU PREP LF (CUSTOM PROCEDURE TRAY) ×3 IMPLANT
TRAY PROCTOSCOPIC FIBER OPTIC (SET/KITS/TRAYS/PACK) IMPLANT
TUBE CONNECTING 20'X1/4 (TUBING) ×1
TUBE CONNECTING 20X1/4 (TUBING) ×2 IMPLANT
UNDERPAD 30X30 (UNDERPADS AND DIAPERS) ×3 IMPLANT
YANKAUER SUCT BULB TIP NO VENT (SUCTIONS) ×3 IMPLANT

## 2017-01-28 NOTE — Anesthesia Postprocedure Evaluation (Signed)
Anesthesia Post Note  Patient: [name]  Procedure(s) Performed: Procedure(s) (LRB): EXAM UNDER ANESTHESIA WITH HEMORRHOIDECTOMY (N/A)     Patient location during evaluation: PACU Anesthesia Type: General Level of consciousness: awake and alert Pain management: pain level controlled Vital Signs Assessment: post-procedure vital signs reviewed and stable Respiratory status: spontaneous breathing, nonlabored ventilation and respiratory function stable Cardiovascular status: blood pressure returned to baseline and stable Postop Assessment: no apparent nausea or vomiting Anesthetic complications: no    Last Vitals:  Vitals:   01/28/17 0930 01/28/17 0945  BP: 133/90 (!) 137/91  Pulse: 72 67  Resp: 15 17  Temp:    SpO2: 100% 100%    Last Pain:  Vitals:   01/28/17 0945  TempSrc:   PainSc: 0-No pain                 Lowella Curb

## 2017-01-28 NOTE — Transfer of Care (Signed)
Immediate Anesthesia Transfer of Care Note  Patient: [name]  Procedure(s) Performed: Procedure(s): EXAM UNDER ANESTHESIA WITH HEMORRHOIDECTOMY (N/A)  Patient Location: PACU  Anesthesia Type:General  Level of Consciousness: awake, alert  and oriented  Airway & Oxygen Therapy: Patient Spontanous Breathing and Patient connected to face mask oxygen  Post-op Assessment: Report given to RN  Post vital signs: Reviewed and stable  Last Vitals:  Vitals:   01/28/17 0926 01/28/17 0927  BP:    Pulse: 76 75  Resp:  10  Temp:  36.8 C  SpO2: 100% 100%    Last Pain:  Vitals:   01/28/17 0824  TempSrc: Oral         Complications: No apparent anesthesia complications

## 2017-01-28 NOTE — Discharge Instructions (Signed)
[facility] _______[facility] ° °RECTAL SURGERY POST OP INSTRUCTIONS: POST OP INSTRUCTIONS ° °Always review your discharge instruction sheet given to you by the facility where your surgery was performed. °IF YOU HAVE DISABILITY OR FAMILY LEAVE FORMS, YOU MUST BRING THEM TO THE OFFICE FOR PROCESSING.   °DO NOT GIVE THEM TO YOUR DOCTOR. ° °1. A  prescription for pain medication may be given to you upon discharge.  Take your pain medication as prescribed, if needed.  If narcotic pain medicine is not needed, then you may take acetaminophen (Tylenol) or ibuprofen (Advil) as needed. °2. Take your usually prescribed medications unless otherwise directed. °3. If you need a refill on your pain medication, please contact your pharmacy.  They will contact our office to request authorization. Prescriptions will not be filled after 5 pm or on week-ends. °4. You should follow a light diet the first 48 hours after arrival home, such as soup and crackers, etc.  Be sure to include lots of fluids daily.  Resume your normal diet 2-3 days after surgery.. °5. Most patients will experience some swelling and discomfort in the rectal area. Ice packs, reclining and warm tub soaks will help.  Swelling and discomfort can take several days to resolve.  °6. It is common to experience some constipation if taking pain medication after surgery.  Increasing fluid intake and taking a stool softener (such as Colace) will usually help or prevent this problem from occurring.  A mild laxative (Milk of Magnesia or Miralax) should be taken according to package directions if there are no bowel movements after 48 hours. °7. Unless discharge instructions indicate otherwise, leave your bandage dry and in place for 24 hours, or remove the bandage if you have a bowel movement. You may notice a small amount of bleeding with bowel movements for the first few days. You may have some packing in the rectum which will come out over the first day or two. You  will need to wear an absorbent pad or soft cotton gauze in your underwear until the drainage stops.it. °8. ACTIVITIES:  You may resume regular (light) daily activities beginning the next day--such as daily self-care, walking, climbing stairs--gradually increasing activities as tolerated.  You may have sexual intercourse when it is comfortable.  Refrain from any heavy lifting or straining until approved by your doctor. °a. You may drive when you are no longer taking prescription pain medication, you can comfortably wear a seatbelt, and you can safely maneuver your car and apply brakes. °b. RETURN TO WORK: : ____________________ °c.  °9. You should see your doctor in the office for a follow-up appointment approximately 2-3 weeks after your surgery.  Make sure that you call for this appointment within a day or two after you arrive home to insure a convenient appointment time. °10. OTHER INSTRUCTIONS:  __________________________________________________________________________________________________________________________________________________________________________________________  °WHEN TO CALL YOUR DOCTOR: °1. Fever over 101.0 °2. Inability to urinate °3. Nausea and/or vomiting °4. Extreme swelling or bruising °5. Continued bleeding from rectum. °6. Increased pain, redness, or drainage from the incision °7. Constipation ° °The clinic staff is available to answer your questions during regular business hours.  Please don’t hesitate to call and ask to speak to one of the nurses for clinical concerns.  If you have a medical emergency, go to the nearest emergency room or call 911.  A surgeon from [facility] is always on call at the hospital ° ° °[address], [address], [place], [place]  [address] ? °   [id number], [place], [place]   [address] [phone] ? [phone] ? FAX [phone] Web site: [email or web address]   Post Anesthesia Home Care Instructions  Activity: Get  plenty of rest for the remainder of the day. A responsible individual must stay with you for 24 hours following the procedure.  For the next 24 hours, DO NOT: -Drive a car -Advertising copywriter -Drink alcoholic beverages -Take any medication unless instructed by your physician -Make any legal decisions or sign important papers.  Meals: Start with liquid foods such as gelatin or soup. Progress to regular foods as tolerated. Avoid greasy, spicy, heavy foods. If nausea and/or vomiting occur, drink only clear liquids until the nausea and/or vomiting subsides. Call your physician if vomiting continues.  Special Instructions/Symptoms: Your throat may feel dry or sore from the anesthesia or the breathing tube placed in your throat during surgery. If this causes discomfort, gargle with warm salt water. The discomfort should disappear within 24 hours.  If you had a scopolamine patch placed behind your ear for the management of post- operative nausea and/or vomiting:  1. The medication in the patch is effective for 72 hours, after which it should be removed.  Wrap patch in a tissue and discard in the trash. Wash hands thoroughly with soap and water. 2. You may remove the patch earlier than 72 hours if you experience unpleasant side effects which may include dry mouth, dizziness or visual disturbances. 3. Avoid touching the patch. Wash your hands with soap and water after contact with the patch.   Information for Discharge Teaching: EXPAREL (bupivacaine liposome injectable suspension)   Your surgeon gave you EXPAREL(bupivacaine) in your surgical incision to help control your pain after surgery.   EXPAREL is a local anesthetic that provides pain relief by numbing the tissue around the surgical site.  EXPAREL is designed to release pain medication over time and can control pain for up to 72 hours.  Depending on how you respond to EXPAREL, you may require less pain medication during your  recovery.  Possible side effects:  Temporary loss of sensation or ability to move in the area where bupivacaine was injected.  Nausea, vomiting, constipation  Rarely, numbness and tingling in your mouth or lips, lightheadedness, or anxiety may occur.  Call your doctor right away if you think you may be experiencing any of these sensations, or if you have other questions regarding possible side effects.  Follow all other discharge instructions given to you by your surgeon or nurse. Eat a healthy diet and drink plenty of water or other fluids.  If you return to the hospital for any reason within 96 hours following the administration of EXPAREL, please inform your health care providers.

## 2017-01-28 NOTE — Anesthesia Procedure Notes (Signed)
Procedure Name: LMA Insertion Performed by: Karen Kitchens Pre-anesthesia Checklist: Patient identified, Emergency Drugs available, Suction available, Patient being monitored and Timeout performed Patient Re-evaluated:Patient Re-evaluated prior to induction Preoxygenation: Pre-oxygenation with 100% oxygen Induction Type: IV induction LMA: LMA inserted LMA Size: 3.0 Tube type: Oral Number of attempts: 1 Tube secured with: Tape Dental Injury: Teeth and Oropharynx as per pre-operative assessment

## 2017-01-28 NOTE — Op Note (Signed)
EXAM UNDER ANESTHESIA WITH HEMORRHOIDECTOMY  Procedure Note  [name] 01/28/2017   Pre-op Diagnosis: Prolapsing Hemorrhoids     Post-op Diagnosis: same  Procedure(s): EXAM UNDER ANESTHESIA WITH SINGLE COLUMN INTERNAL/EXTERNAL HEMORRHOIDECTOMY  Surgeon(s): Abigail Miyamoto, MD  Anesthesia: General  Staff:  Circulator: Raliegh Scarlet, RN Scrub Person: Wardell Heath, CST  Estimated Blood Loss: Minimal               Specimens: sent to path  Procedure: The patient was brought to the operating room and identified as the correct patient. She is placed supine on the operating table and general anesthesia was induced. The patient was then placed in lithotomy position. Her perianal area was prepped and draped in the usual sterile fashion. I anesthetized the surrounding skin and perianal area with Experal.  I then placed Wu retractor into the anal canal. The patient had one large internal and external hemorrhoidal column anteriorly. I grasped it with an ounce clamp and excised with the harmonic scalpel. I then closed the mucosal defect with an interlocked 2-0 chromic suture. No other intrarenal pathology was identified. I excised several small anal skin tags with the harmonic scalpel. The hemorrhoid was sent to pathology for evaluation. I placed these of Gelfoam into the anal canal. The patient tolerated the procedure well. I injected further anesthetic around the canal. She was then extubated in the operating room and taken in Wu stable condition to the recovery.          [name]   Date: 01/28/2017  Time: 9:16 AM

## 2017-01-28 NOTE — Anesthesia Preprocedure Evaluation (Signed)
Anesthesia Evaluation  Patient identified by MRN, date of birth, ID band Patient awake    Reviewed: Allergy & Precautions, NPO status , Patient's Chart, lab work & pertinent test results  History of Anesthesia Complications Negative for: history of anesthetic complications  Airway Mallampati: II   Neck ROM: Full    Dental  (+) Dental Advisory Given, Teeth Intact   Pulmonary neg pulmonary ROS,    breath sounds clear to auscultation       Cardiovascular negative cardio ROS   Rhythm:Regular     Neuro/Psych Anxiety Depression negative neurological ROS  negative psych ROS   GI/Hepatic negative GI ROS, Neg liver ROS,   Endo/Other  negative endocrine ROS  Renal/GU negative Renal ROS  negative genitourinary   Musculoskeletal negative musculoskeletal ROS (+)   Abdominal (+)  Abdomen: soft.    Peds negative pediatric ROS (+)  Hematology negative hematology ROS (+)   Anesthesia Other Findings   Reproductive/Obstetrics negative OB ROS                             Anesthesia Physical  Anesthesia Plan  ASA: II  Anesthesia Plan: General   Post-op Pain Management:    Induction: Intravenous  PONV Risk Score and Plan: 3 and Ondansetron, Dexamethasone and Midazolam  Airway Management Planned: Oral ETT  Additional Equipment:   Intra-op Plan:   Post-operative Plan: Extubation in OR  Informed Consent: I have reviewed the patients History and Physical, chart, labs and discussed the procedure including the risks, benefits and alternatives for the proposed anesthesia with the patient or authorized representative who has indicated his/her understanding and acceptance.   Dental advisory given  Plan Discussed with: CRNA  Anesthesia Plan Comments:         Anesthesia Quick Evaluation

## 2017-01-28 NOTE — Interval H&P Note (Signed)
History and Physical Interval Note: no change in H and P  01/28/2017 8:17 AM  [name]  has presented today for surgery, with the diagnosis of hemorrhoid  The various methods of treatment have been discussed with the patient and family. After consideration of risks, benefits and other options for treatment, the patient has consented to  Procedure(s): EXAM UNDER ANESTHESIA WITH HEMORRHOIDECTOMY (N/A) as a surgical intervention .  The patient's history has been reviewed, patient examined, no change in status, stable for surgery.  I have reviewed the patient's chart and labs.  Questions were answered to the patient's satisfaction.     Amritha Yorke A

## 2017-01-29 ENCOUNTER — Encounter (HOSPITAL_BASED_OUTPATIENT_CLINIC_OR_DEPARTMENT_OTHER): Payer: Self-pay | Admitting: Surgery

## 2017-02-03 NOTE — Progress Notes (Signed)
Chief Complaint  Patient presents with  . f/u vertigo    it is a little better, pt has a hemorrhoid  and is very sore.  Declines flu vaccine    HPI  Vertigo Pt reports that she has a recurrence of vertigo She states that she got a break from the episodes She states that now she gets some dizziness at night She states that this episode started  She reports that the meclizine causes the vertigo to go away  Hemorrhoid and Rectal Pain She reports that she had hemorrhoidectomy She still has some pain but no bleeding She states that her pain is with defecation   Past Medical History:  Diagnosis Date  . Anxiety   . BPPV (benign paroxysmal positional vertigo)   . Depression   . Hemorrhoid   . No pertinent past medical history   . Postpartum care and examination of lactating mother 11/20/2010  . Postpartum care following vaginal delivery (4/22) 08/31/2014    Current Outpatient Prescriptions  Medication Sig Dispense Refill  . lidocaine (XYLOCAINE) 5 % ointment Apply 1 application topically as needed. 35.44 g 0  . LORazepam (ATIVAN) 1 MG tablet Take 1 tablet (1 mg total) by mouth 3 (three) times daily as needed (dizziness / nausea). 10 tablet 0  . meclizine (ANTIVERT) 25 MG tablet Take 1 tablet (25 mg total) by mouth 3 (three) times daily as needed for dizziness. 90 tablet 6  . oxyCODONE-acetaminophen (ROXICET) 5-325 MG tablet Take 1-2 tablets by mouth every 4 (four) hours as needed for severe pain. 30 tablet 0   No current facility-administered medications for this visit.     Allergies: No Known Allergies  Past Surgical History:  Procedure Laterality Date  . CERVICAL CERCLAGE    . CERVICAL CERCLAGE N/A 02/21/2014   Procedure: Modified McDonald CERVICAL CERCLAGE ;  Surgeon: Genia Del, MD;  Location: [facility];  Service: Gynecology;  Laterality: N/A;  EDD: 09/05/14  . EVALUATION UNDER ANESTHESIA WITH HEMORRHOIDECTOMY N/A 01/28/2017   Procedure: EXAM UNDER ANESTHESIA WITH  HEMORRHOIDECTOMY;  Surgeon: Abigail Miyamoto, MD;  Location: [facility];  Service: General;  Laterality: N/A;  . HEMORRHOID SURGERY     after delivery  . LAPAROSCOPIC TUBAL LIGATION Bilateral 09/11/2014   Procedure: LAPAROSCOPIC TUBAL LIGATION With Cautery;  Surgeon: Genia Del, MD;  Location: [facility];  Service: Gynecology;  Laterality: Bilateral;  . TUBAL LIGATION      Social History   Social History  . Marital status: Married    Spouse name: N/A  . Number of children: N/A  . Years of education: N/A   Social History Main Topics  . Smoking status: Never Smoker  . Smokeless tobacco: Never Used  . Alcohol use Yes     Comment: social  . Drug use: No  . Sexual activity: Yes    Birth control/ protection: Surgical     Comment: BTL   Other Topics Concern  . None   Social History Narrative  . None    ROS Review of Systems See HPI Constitution: No fevers or chills No malaise No diaphoresis Skin: No rash or itching Eyes: no blurry vision, no double vision GU: no dysuria or hematuria Skin: light colored skin rash on face and neck  Objective: Vitals:   02/04/17 0934  BP: 114/70  Pulse: 97  Resp: 16  Temp: 98.9 F (37.2 C)  TempSrc: Oral  SpO2: 98%  Weight: 205 lb 6.4 oz (93.2 kg)  Height:  (1.702 m)  Physical Exam  Constitutional: She is oriented to person, place, and time. She appears well-developed and well-nourished.  HENT:  Head: Normocephalic and atraumatic.  Right Ear: External ear normal.  Left Ear: External ear normal.  Mouth/Throat: Oropharynx is clear and moist. No oropharyngeal exudate.  Eyes: Conjunctivae and EOM are normal.  Cardiovascular: Normal rate, regular rhythm and normal heart sounds.   No murmur heard. Pulmonary/Chest: Effort normal and breath sounds normal. No respiratory distress. She has no wheezes. She has no rales.  Neurological: She is alert and oriented to person, place, and time. No cranial nerve  deficit.  Skin: Skin is warm. Capillary refill takes less than 2 seconds.  Hypopigmented skin near the nose and mouth  Psychiatric: She has a normal mood and affect. Her behavior is normal. Judgment and thought content normal.    Assessment and Plan [name] was seen today for f/u vertigo.  Diagnoses and all orders for this visit:  Vertigo- will refill antivert  -     meclizine (ANTIVERT) 25 MG tablet; Take 1 tablet (25 mg total) by mouth 3 (three) times daily as needed for dizziness.  S/P hemorrhoidectomy Rectal pain -  Continue with Proctology orders   Other orders -     Cancel: Flu Vaccine QUAD 36+ mos IM     [name]

## 2017-02-04 ENCOUNTER — Encounter: Payer: Self-pay | Admitting: Family Medicine

## 2017-02-04 ENCOUNTER — Ambulatory Visit (INDEPENDENT_AMBULATORY_CARE_PROVIDER_SITE_OTHER): Payer: Commercial Managed Care - PPO | Admitting: Family Medicine

## 2017-02-04 VITALS — BP 114/70 | HR 97 | Temp 98.9°F | Resp 16 | Ht 67.0 in | Wt 205.4 lb

## 2017-02-04 DIAGNOSIS — R42 Dizziness and giddiness: Secondary | ICD-10-CM | POA: Diagnosis not present

## 2017-02-04 DIAGNOSIS — Z9889 Other specified postprocedural states: Secondary | ICD-10-CM | POA: Diagnosis not present

## 2017-02-04 DIAGNOSIS — K6289 Other specified diseases of anus and rectum: Secondary | ICD-10-CM

## 2017-02-04 DIAGNOSIS — Z8719 Personal history of other diseases of the digestive system: Secondary | ICD-10-CM | POA: Diagnosis not present

## 2017-02-04 MED ORDER — MECLIZINE HCL 25 MG PO TABS
25.0000 mg | ORAL_TABLET | Freq: Three times a day (TID) | ORAL | 6 refills | Status: DC | PRN
Start: 2017-02-04 — End: 2017-04-13

## 2017-02-04 NOTE — Patient Instructions (Addendum)
     IF you received an x-ray today, you will receive an invoice from [facility]. Please contact [facility] at [phone] with questions or concerns regarding your invoice.   IF you received labwork today, you will receive an invoice from [place]. Please contact [facility] at [phone] with questions or concerns regarding your invoice.   Our billing staff will not be able to assist you with questions regarding bills from these companies.  You will be contacted with the lab results as soon as they are available. The fastest way to get your results is to activate your My Chart account. Instructions are located on the last page of this paperwork. If you have not heard from [place] regarding the results in 2 weeks, please contact this office.     Vrtigo (Vertigo) El trmino vrtigo hace referencia a la sensacin de que se est moviendo cuando no es as. El vrtigo puede causarle la sensacin de que las cosas que lo rodean se estn moviendo cuando en realidad eso no sucede. Esta sensacin puede aparecer y desaparecer en cualquier momento. El vrtigo suele desaparecer solo. CUIDADOS EN EL HOGAR  No haga movimientos rpidos.  No conduzca.  No use maquinaria pesada.  No haga nada que pueda ser peligroso para usted o para Economist en el caso de que se produjera una crisis de vrtigo.  Sintese de inmediato si est mareado o tiene dificultad para mantener el equilibrio.  Tome los medicamentos de venta libre y los recetados solamente como se lo haya indicado el mdico.  Siga las indicaciones de mdico en lo que respecta a las posiciones o los movimientos que Personnel officer.  Beba suficiente lquido para mantener el pis (orina) claro o de color amarillo plido.  Concurra a todas las visitas de control como se lo haya indicado el mdico. Esto es importante. SOLICITE AYUDA SI:  Los medicamentos no le [employer] vrtigo.  Tiene fiebre.  Los problemas empeoran o le  aparecen sntomas nuevos.  Sus familiares o amigos observan cambios en su comportamiento.  Tiene malestar estomacal (nuseas) o vomita.  Tiene sensacin de hormigueo o se le adormece una parte del cuerpo. SOLICITE AYUDA DE INMEDIATO SI:  Tiene dificultad para moverse o para caminar.  Esta mareado todo [employer].  Pierde el conocimiento (se desmaya).  Tiene dolores de [place] muy intensos.  Se siente dbil o tiene problemas para [employer], los brazos o las piernas.  Tiene cambios en la audicin.  Tiene cambios en la visin.  Tiene rigidez en el cuello.  La luz brillante empieza a molestarlo. Esta informacin no tiene Theme park manager el consejo del mdico. Asegrese de hacerle al mdico cualquier pregunta que tenga. Document Released: 05/30/2010 Document Revised: 01/16/2015 Document Reviewed: 08/20/2014 Elsevier Interactive Patient Education  [employer].

## 2017-04-13 ENCOUNTER — Encounter: Payer: Self-pay | Admitting: Emergency Medicine

## 2017-04-13 ENCOUNTER — Ambulatory Visit (INDEPENDENT_AMBULATORY_CARE_PROVIDER_SITE_OTHER): Payer: Self-pay | Admitting: Emergency Medicine

## 2017-04-13 VITALS — BP 122/72 | HR 94 | Temp 98.0°F | Resp 17 | Ht 67.0 in | Wt 209.0 lb

## 2017-04-13 DIAGNOSIS — R21 Rash and other nonspecific skin eruption: Secondary | ICD-10-CM

## 2017-04-13 DIAGNOSIS — T7840XA Allergy, unspecified, initial encounter: Secondary | ICD-10-CM

## 2017-04-13 MED ORDER — PREDNISONE 20 MG PO TABS: 40.0000 mg | ORAL_TABLET | Freq: Every day | ORAL | 0 refills | Status: AC

## 2017-04-13 NOTE — Patient Instructions (Addendum)
Tome Benadryl 25 mg tres veces al dia por 3 dias.   IF you received an x-ray today, you will receive an invoice from [facility]. Please contact [facility] at [phone] with questions or concerns regarding your invoice.   IF you received labwork today, you will receive an invoice from [facility]. Please contact [facility] at [phone] with questions or concerns regarding your invoice.   Our billing staff will not be able to assist you with questions regarding bills from these companies.  You will be contacted with the lab results as soon as they are available. The fastest way to get your results is to activate your My Chart account. Instructions are located on the last page of this paperwork. If you have not heard from us regarding the results in 2 weeks, please contact this office.      Ronchas (Hives) Las ronchas (urticaria) son reas enrojecidas e hinchadas en la piel que ocasionan picazn. Las ronchas pueden Physicist, medicalaparecer en cualquier parte del cuerpo y Control and instrumentation engineertener diferentes tamaos. Pueden ser del tamao de la punta de un bolgrafo o mucho ms grandes. Las ronchas suelen mejorar en el transcurso de 24horas (ronchas [place] Springagudas). En otros casos, aparecen ronchas nuevas despus de que las viejas desaparecen. Esto puede continuar durante Rite Aidmuchos das o semanas (ronchas crnicas). La causa de las ronchas es una reaccin del cuerpo a un agente irritante o a algo que le produce alergia (factor desencadenante). Puede tener ronchas inmediatamente despus de estar cerca de un factor desencadenante u horas ms tarde. No se transmiten de persona a persona (no son contagiosas). Las ronchas pueden empeorar si se las rasca, si hace ejercicio o si est preocupado (estrs emocional). CUIDADOS EN EL HOGAR Medicamentos  Tome o aplique los medicamentos de venta libre y los recetados solamente como se lo haya indicado el mdico.  Si le recetaron un antibitico, tmelo como se lo haya indicado el  mdico. No deje de tomar los antibiticos aunque comience a sentirse mejor. Cuidado de la piel  Aplique paos fros y hmedos (compresas fras) en las zonas hinchadas, enrojecidas y Advertising copywriterque le pican.  No se rasque la piel. No se frote la piel. Instrucciones generales  No se duche ni tome baos de inmersin con agua caliente. Podra empeorar la picazn.  No use ropa ajustada.  Aplquese pantalla solar y use ropa que le cubra la piel cuando est al [place] libre.  Evite los factores desencadenantes que le causan las ronchas. Lleve un registro para Education officer, environmentalrealizar un seguimiento de aquello que le produce ronchas. Escriba los siguientes datos: ? Los medicamentos que toma. ? Lo que usted come y bebe. ? Los productos que [place] en la piel.  Concurra a todas las visitas de control como se lo haya indicado el mdico. Esto es importante. SOLICITE AYUDA SI:  Los sntomas no mejoran con los medicamentos.  Le duelen las articulaciones o estas se inflaman. SOLICITE AYUDA DE INMEDIATO SI:  Tiene fiebre.  Siente dolor en el abdomen.  Tiene la lengua o los labios hinchados.  Tiene los prpados hinchados.  Siente el pecho o la garganta cerrados.  Tiene problemas para respirar o tragar. Estos sntomas pueden Customer service managerindicar una emergencia. No espere hasta que los sntomas desaparezcan. Solicite atencin mdica de inmediato. Comunquese con el servicio de emergencias de su localidad (911 en los [place]). No conduzca por sus propios medios OfficeMax Incorporatedhasta el hospital. Esta informacin no tiene Theme park managercomo fin reemplazar el consejo del mdico. Asegrese de hacerle al mdico cualquier pregunta que tenga. Document  Released: 10/27/2011 Document Revised: 08/19/2015 Document Reviewed: 02/13/2015 Elsevier Interactive Patient Education  [employer]2018 Elsevier Inc.

## 2017-04-13 NOTE — Progress Notes (Signed)
[name] 41 y.o.   Chief Complaint  Patient presents with  . Rash    HISTORY OF PRESENT ILLNESS: This is a 41 y.o. female complaining of itchy rash x 5 days. No SOB; had headache yesterday with nausea and vomiting; not pregnant; no other associated symptoms.  Rash  This is a new problem. The current episode started in the past 7 days. The problem has been waxing and waning since onset. The rash is diffuse. The rash is characterized by itchiness and redness. It is unknown (possible food) if there was an exposure to a precipitant. Associated symptoms include vomiting. Pertinent negatives include no anorexia, congestion, cough, diarrhea, eye pain, facial edema, fatigue, fever, joint pain, rhinorrhea, shortness of breath or sore throat. Past treatments include nothing. There is no history of allergies or asthma.     Prior to Admission medications   Medication Sig Start Date End Date Taking? Authorizing Provider  meclizine (ANTIVERT) 32 MG tablet Take 32 mg by mouth 3 (three) times daily as needed.   Yes [provider]    No Known Allergies  Patient Active Problem List   Diagnosis Date Noted  . BPPV (benign paroxysmal positional vertigo), right 11/25/2016  . Adjustment disorder with mixed anxiety and depressed mood 07/20/2016  . Chronic fatigue 07/20/2016    Past Medical History:  Diagnosis Date  . Anxiety   . BPPV (benign paroxysmal positional vertigo)   . Depression   . Hemorrhoid   . No pertinent past medical history   . Postpartum care and examination of lactating mother 11/20/2010  . Postpartum care following vaginal delivery (4/22) 08/31/2014    Past Surgical History:  Procedure Laterality Date  . CERVICAL CERCLAGE    . CERVICAL CERCLAGE N/A 02/21/2014   Procedure: Modified McDonald CERVICAL CERCLAGE ;  Surgeon: Genia DelMarie-Lyne Lavoie, MD;  Location: [facility];  Service: Gynecology;  Laterality: N/A;  EDD: 09/05/14  . EVALUATION UNDER ANESTHESIA WITH  HEMORRHOIDECTOMY N/A 01/28/2017   Procedure: EXAM UNDER ANESTHESIA WITH HEMORRHOIDECTOMY;  Surgeon: Abigail MiyamotoBlackman, Douglas, MD;  Location: [facility];  Service: General;  Laterality: N/A;  . HEMORRHOID SURGERY     after delivery  . LAPAROSCOPIC TUBAL LIGATION Bilateral 09/11/2014   Procedure: LAPAROSCOPIC TUBAL LIGATION With Cautery;  Surgeon: Genia DelMarie-Lyne Lavoie, MD;  Location: [facility];  Service: Gynecology;  Laterality: Bilateral;  . TUBAL LIGATION      Social History   Socioeconomic History  . Marital status: Married    Spouse name: Not on file  . Number of children: Not on file  . Years of education: Not on file  . Highest education level: Not on file  Social Needs  . Financial resource strain: Not on file  . Food insecurity - worry: Not on file  . Food insecurity - inability: Not on file  . Transportation needs - medical: Not on file  . Transportation needs - non-medical: Not on file  Occupational History  . Not on file  Tobacco Use  . Smoking status: Never Smoker  . Smokeless tobacco: Never Used  Substance and Sexual Activity  . Alcohol use: Yes    Comment: social  . Drug use: No  . Sexual activity: Yes    Birth control/protection: Surgical    Comment: BTL  Other Topics Concern  . Not on file  Social History Narrative  . Not on file    Family History  Problem Relation Age of Onset  . Heart disease Mother   . Hyperlipidemia Mother  Review of Systems  Constitutional: Negative for chills, fatigue and fever.  HENT: Negative.  Negative for congestion, rhinorrhea and sore throat.   Eyes: Negative.  Negative for pain.  Respiratory: Negative for cough, hemoptysis, shortness of breath and wheezing.   Cardiovascular: Negative.  Negative for chest pain and palpitations.  Gastrointestinal: Positive for vomiting. Negative for abdominal pain, anorexia, diarrhea and nausea.  Genitourinary: Negative.  Negative for dysuria and hematuria.  Musculoskeletal: Negative  for back pain, joint pain, myalgias and neck pain.  Skin: Positive for rash.  Neurological: Negative.  Negative for dizziness and headaches.  Endo/Heme/Allergies: Negative.   All other systems reviewed and are negative.  Vitals:   04/13/17 1013  BP: 122/72  Pulse: 94  Resp: 17  Temp: 98 F (36.7 C)  SpO2: 98%     Physical Exam  Constitutional: She is oriented to person, place, and time. She appears well-developed and well-nourished.  HENT:  Head: Normocephalic and atraumatic.  Nose: Nose normal.  Mouth/Throat: Oropharynx is clear and moist.  Eyes: Conjunctivae and EOM are normal. Pupils are equal, round, and reactive to light.  Neck: Normal range of motion. Neck supple.  Cardiovascular: Normal rate, regular rhythm and normal heart sounds.  Pulmonary/Chest: Effort normal and breath sounds normal.  Abdominal: Soft. Bowel sounds are normal. She exhibits no distension. There is no tenderness.  Musculoskeletal: Normal range of motion.  Neurological: She is alert and oriented to person, place, and time. No sensory deficit. She exhibits normal muscle tone.  Skin: Skin is warm and dry. Capillary refill takes less than 2 seconds. Rash (maculo-papular; trunk and extremities) noted.  Psychiatric: She has a normal mood and affect. Her behavior is normal.  Vitals reviewed.    ASSESSMENT & PLAN: [name] was seen today for rash.  Diagnoses and all orders for this visit:  Acute allergic reaction, initial encounter -     predniSONE (DELTASONE) 20 MG tablet; Take 2 tablets (40 mg total) by mouth daily with breakfast for 5 days.  Rash and nonspecific skin eruption    Patient Instructions    Tome Benadryl 25 mg tres veces al dia por 3 dias.   IF you received an x-ray today, you will receive an invoice from [facility]. Please contact [facility] at [phone] with questions or concerns regarding your invoice.   IF you received labwork today, you will receive an  invoice from [facility]. Please contact [facility] at [phone] with questions or concerns regarding your invoice.   Our billing staff will not be able to assist you with questions regarding bills from these companies.  You will be contacted with the lab results as soon as they are available. The fastest way to get your results is to activate your My Chart account. Instructions are located on the last page of this paperwork. If you have not heard from [place] regarding the results in 2 weeks, please contact this office.      Ronchas (Hives) Las ronchas (urticaria) son reas enrojecidas e hinchadas en la piel que ocasionan picazn. Las ronchas pueden Physicist, medical parte del cuerpo y Control and instrumentation engineer. Pueden ser del tamao de la punta de un bolgrafo o mucho ms grandes. Las ronchas suelen mejorar en el transcurso de 24horas (ronchas [place]). En otros casos, aparecen ronchas nuevas despus de que las viejas desaparecen. Esto puede continuar durante [employer] o semanas (ronchas crnicas). La causa de las ronchas es una reaccin del cuerpo a un agente irritante o a Engineering geologist  produce alergia (factor desencadenante). Puede tener ronchas inmediatamente despus de estar cerca de un factor desencadenante u horas ms tarde. No se transmiten de persona a persona (no son contagiosas). Las ronchas pueden empeorar si se las rasca, si hace ejercicio o si est preocupado (estrs emocional). CUIDADOS EN EL HOGAR Medicamentos  Tome o aplique los medicamentos de venta libre y los recetados solamente como se lo haya indicado el mdico.  Si le recetaron un antibitico, tmelo como se lo haya indicado el mdico. No deje de tomar los antibiticos aunque comience a sentirse mejor. Cuidado de la piel  Aplique paos fros y hmedos (compresas fras) en las zonas hinchadas, enrojecidas y Advertising copywriterque le pican.  No se rasque la piel. No se frote la piel. Instrucciones generales  No se duche ni tome baos de  inmersin con agua caliente. Podra empeorar la picazn.  No use ropa ajustada.  Aplquese pantalla solar y use ropa que le cubra la piel cuando est al [place] libre.  Evite los factores desencadenantes que le causan las ronchas. Lleve un registro para Education officer, environmentalrealizar un seguimiento de aquello que le produce ronchas. Escriba los siguientes datos: ? Los medicamentos que toma. ? Lo que usted come y bebe. ? Los productos que [place] en la piel.  Concurra a todas las visitas de control como se lo haya indicado el mdico. Esto es importante. SOLICITE AYUDA SI:  Los sntomas no mejoran con los medicamentos.  Le duelen las articulaciones o estas se inflaman. SOLICITE AYUDA DE INMEDIATO SI:  Tiene fiebre.  Siente dolor en el abdomen.  Tiene la lengua o los labios hinchados.  Tiene los prpados hinchados.  Siente el pecho o la garganta cerrados.  Tiene problemas para respirar o tragar. Estos sntomas pueden Customer service managerindicar una emergencia. No espere hasta que los sntomas desaparezcan. Solicite atencin mdica de inmediato. Comunquese con el servicio de emergencias de su localidad (911 en los [place]). No conduzca por sus propios medios OfficeMax Incorporatedhasta el hospital. Esta informacin no tiene Theme park managercomo fin reemplazar el consejo del mdico. Asegrese de hacerle al mdico cualquier pregunta que tenga. Document Released: 10/27/2011 Document Revised: 08/19/2015 Document Reviewed: 02/13/2015 Elsevier Interactive Patient Education  2018 Elsevier Inc.      Edwina BarthMiguel Sheretta Grumbine, MD [facility]

## 2017-04-20 ENCOUNTER — Other Ambulatory Visit: Payer: Self-pay

## 2017-04-20 ENCOUNTER — Ambulatory Visit (INDEPENDENT_AMBULATORY_CARE_PROVIDER_SITE_OTHER): Payer: Self-pay | Admitting: Family Medicine

## 2017-04-20 ENCOUNTER — Encounter: Payer: Self-pay | Admitting: Family Medicine

## 2017-04-20 VITALS — BP 110/72 | HR 95 | Temp 99.0°F | Resp 16 | Ht 67.0 in | Wt 216.0 lb

## 2017-04-20 DIAGNOSIS — L299 Pruritus, unspecified: Secondary | ICD-10-CM

## 2017-04-20 DIAGNOSIS — R21 Rash and other nonspecific skin eruption: Secondary | ICD-10-CM

## 2017-04-20 DIAGNOSIS — L500 Allergic urticaria: Secondary | ICD-10-CM

## 2017-04-20 DIAGNOSIS — Z131 Encounter for screening for diabetes mellitus: Secondary | ICD-10-CM

## 2017-04-20 LAB — GLUCOSE, POCT (MANUAL RESULT ENTRY): POC GLUCOSE: 85 mg/dL (ref 70–99)

## 2017-04-20 MED ORDER — PREDNISONE 20 MG PO TABS: ORAL_TABLET | ORAL | 0 refills | Status: DC

## 2017-04-20 MED ORDER — HYDROXYZINE HCL 25 MG PO TABS: 25.0000 mg | ORAL_TABLET | Freq: Four times a day (QID) | ORAL | 0 refills | Status: DC | PRN

## 2017-04-20 NOTE — Patient Instructions (Addendum)
Hydroxyzine 1-2 cada 6 horas si necesario.  Prednisone - 3 pastillas hoy.  Hydrocortisone 2 veces cada dia si necesario.  regrese en dos dias con Dr. Janee MornSagardia o [place]  regrese aqui o cuarto de emergencia si empeorse   Ronchas (Hives) Las ronchas (urticaria) son reas enrojecidas e hinchadas en la piel que ocasionan picazn. Las ronchas pueden aparecer en cualquier parte del cuerpo y variar en tamao. Pueden ser del tamao de la punta de un bolgrafo o mucho ms grandes. Las ronchas suelen mejorar en el transcurso de 24horas (ronchas [place]agudas). En otros casos, despus de que desaparecen, aparecen ronchas nuevas. Este ciclo puede prolongarse durante varios das o semanas (ronchas crnicas). Las ronchas aparecen como consecuencia de una reaccin del cuerpo a un agente irritante o a algo a lo que usted es alrgico (factor desencadenante). Cuando se expone a un factor desencadenante, su cuerpo libera una sustancia qumica (histamina) que causa enrojecimiento, picazn e hinchazn. Las ronchas se pueden formar inmediatamente despus de exponerse a un factor desencadenante o bien al cabo de varias horas. No se transmiten de persona a persona (no son contagiosas). Las ronchas pueden empeorar al rascarse, Radio producerhacer ejercicios y por estrs emocional. CAUSAS Las causas de esta afeccin incluyen lo siguiente:  Alergias a determinadas comidas o ingredientes.  Las picaduras o mordeduras de insectos.  Exposicin al polen o a la caspa de las [address].  Contacto con el ltex o con sustancias qumicas.  Exposicin a la luz del sol, al calor o al fro.  Actividad fsica.  Estrs. Algunas enfermedades y tratamientos pueden ocasionar ronchas. Estos incluyen los siguientes:  Virus, incluido el virus de la gripe.  Infecciones bacterianas, como infecciones en las vas urinarias y Chief Executive Officeramigdalitis estreptoccica.  Trastornos, como la vasculitis, el lupus o la enfermedad tiroidea.  Determinados  medicamentos.  Vacunas contra la Programmer, multimediaalergia.  Transfusiones de Ben[place]angre. Algunas veces, se desconocen los factores que causan las ronchas (ronchas idiopticas). FACTORES DE RIESGO Es ms probable que esta afeccin se manifieste en:  Mujeres.  Personas que padecen Duke Energyalergias alimentarias, en especial a los ctricos, la Cal[place]eche, los Williamshuevos, el man, los frutos secos o los mariscos.  Personas que son alrgicas a lo siguiente: ? Medicamentos. ? Ltex. ? Insectos. ? Animales. ? Polen.  Personas que padecen ciertas afecciones, como el lupus o la enfermedad tiroidea. SNTOMAS El principal sntoma de esta afeccin es la presencia de parches o pequeos bultos elevados de color rojo o blanco en la piel que ocasionan picazn. Estas reas:  Pueden convertirse en ronchas grandes e inflamadas.  Pueden cambiar de forma y Czech Republicubicacin de manera rpida y repetida.  Pueden aparecer en forma de ronchas separadas o ronchas que se conectan y abarcan una zona extensa de la piel.  Pueden causar escozor o volverse dolorosas.  Pueden volverse blancas (palidecer) al presionar en el centro. En casos graves, las Weatherby Lakemanos, los pies y el rostro tambin pueden hincharse. Esto podra ocurrir si las ronchas se desarrollan ms profundamente en la piel. DIAGNSTICO Esta afeccin se diagnostica en funcin de los sntomas, los antecedentes mdicos y un examen fsico. Podran realizarle pruebas de [place]re, Comor[place]orina o piel para descubrir qu es lo que causa las ronchas y Sales promotion account executivedescartar otros problemas de Freight forwardersalud. El mdico tambin podra tomar una pequea Luxembourgmuestra de piel del rea afectada y analizarla con un microscopio (biopsia). TRATAMIENTO El tratamiento depende de la gravedad de la enfermedad. El mdico podra recomendarle aplicar paos mojados con agua fra (compresas fras) o tomar duchas con agua fra para  aliviar la picazn. A veces, las ronchas se tratan con medicamentos, como por  ejemplo:  Antihistamnicos.  Corticoides.  Antibiticos.  Medicamentos inyectables (omalizumab). El mdico podra recetar esto si usted presenta ronchas idiopticas y contina teniendo sntomas aun despus del tratamiento con antihistamnicos. En los casos ms graves, podra ser necesario aplicar una inyeccin de adrenalina (epinefrina) para prevenir una reaccin alrgica potencialmente mortal (anafilaxis). INSTRUCCIONES PARA EL CUIDADO EN EL HOGAR Medicamentos  Tome o aplquese los medicamentos de venta libre y recetados solamente como se lo haya indicado el mdico.  Si le recetaron un antibitico, tmelo como se lo haya indicado el mdico. No deje de tomar los antibiticos aunque comience a Actor. Cuidado de la piel  Aplique compresas fras en las zonas afectadas.  No se rasque ni se refriegue la piel. Instrucciones generales  No se duche ni tome baos de inmersin con agua caliente. Podra empeorar la picazn.  No use ropa ajustada.  Use pantalla solar y ropa protectora cuando est al [place].  Evite las sustancias que causan las ronchas. Lleve un registro para Presenter, broadcasting de aquello que le ocasiona ronchas. Escriba los siguientes datos: ? Los medicamentos que toma. ? Lo que usted come y bebe. ? Los productos que [place] en la piel.  Concurra a todas las visitas de control como se lo haya indicado el mdico. Esto es importante. SOLICITE ATENCIN MDICA SI:  Los sntomas no se [employer].  Le duelen las articulaciones o estas se inflaman.  SOLICITE ATENCIN MDICA DE INMEDIATO SI:  Tiene fiebre.  Siente dolor en el abdomen.  Tiene la lengua o los labios hinchados.  Tiene los prpados hinchados.  Siente el pecho o la garganta cerrados.  Tiene problemas para respirar o tragar. Estos sntomas pueden representar un problema grave que constituye Radio broadcast assistant. No espere hasta que los sntomas desaparezcan. Solicite atencin  mdica de inmediato. Comunquese con el servicio de emergencias de su localidad (911 en los [place]). No conduzca por sus propios medios [employer]Max Incorporated. Esta informacin no tiene Theme park manager el consejo del mdico. Asegrese de hacerle al mdico cualquier pregunta que tenga. Document Released: 04/27/2005 Document Revised: 08/19/2015 Document Reviewed: 02/13/2015 Elsevier Interactive Patient Education  2017 Elsevier Inc.  Erupcin cutnea (Rash) Una erupcin cutnea es un cambio en el color de la piel. Una erupcin tambin puede cambiar la forma en que se siente la piel. Hay muchas afecciones y [employer] que pueden causar una erupcin. INSTRUCCIONES PARA EL CUIDADO EN EL HOGAR Est atento a cualquier cambio en los sntomas. Estas indicaciones pueden ayudarlo con el trastorno: Intel Corporation o aplquese los medicamentos de venta libre y Building control surveyor como se lo haya indicado el mdico. Estos pueden incluir lo siguiente:  Crema con corticoides.  Lociones para Associate Professor.  Antihistamnicos por va oral. Cuidado de la piel  Aplique compresas fras en las zonas afectadas.  Trate de tomar un bao con lo siguiente: ? Sales de [place]. Siga las instrucciones del envase. Puede conseguirlas en la tienda de comestibles o la farmacia local. ? Bicarbonato de sodio. Vierta un poco en la baera como se lo haya indicado el mdico. ? Avena coloidal. Siga las instrucciones del envase. Puede conseguirla en la tienda de comestibles o la farmacia local.  Intente colocarse una pasta de bicarbonato de sodio sobre la piel. Agregue agua al bicarbonato hasta que tenga la consistencia de una pasta.  No se rasque ni se refriegue la piel.  Evite cubrir la erupcin. Asegrese de que la erupcin est expuesta al aire todo lo posible. Instrucciones generales  Evite los baos de inmersin y las duchas calientes, que pueden empeorar la picazn. Neomia DearUna ducha fra puede  aliviar.  Evite los detergentes y los jabones perfumados, y los perfumes. Utilice jabones, detergentes, perfumes y cosmticos suaves.  Evite las sustancias que causan la erupcin. Lleve un diario como ayuda para registrar lo que le causa erupcin. Escriba los siguientes datos: ? Lo que come. ? Los cosmticos que Coc[place] ([place]) Islandsutiliza. ? Lo que bebe. ? La ropa que Botswanausa. Esto incluye las alhajas.  Concurra a todas las visitas de control como se lo haya indicado el mdico. Esto es importante. SOLICITE ATENCIN MDICA SI:  Berenice Primasranspira de noche.  Pierde peso.  Orina ms de lo normal.  Se siente dbil.  Vomita.  Tiene un color amarillo en la piel o en la zona blanca del ojo (ictericia).  La piel: ? Siente hormigueos. ? Se adormece.  La erupcin: ? No desaparece despus de Principal Financialvarios das. ? Empeora.  Usted: ? Est inusualmente sediento. ? Est ms cansado que lo habitual.  Tiene los siguientes sntomas: ? Sntomas nuevos. ? Dolor en el abdomen. ? Fiebre. ? Diarrea.  SOLICITE ATENCIN MDICA DE INMEDIATO SI:  Presenta una erupcin que cubre todo el cuerpo o la mayor parte de [place]. La erupcin puede ser dolorosa o no.  Tiene ampollas que tienen las siguientes caractersticas: ? Se ubican sobre la erupcin. ? Se agrandan o crecen juntas. ? Son dolorosas. ? Estn dentro de la nariz o la boca.  Le aparece una erupcin que tiene las siguientes caractersticas: ? Tiene pequeas manchas moradas, como si fueran pinchazos, en todo el cuerpo. ? Tiene un aspecto parecido a Racheal Patchesuna diana o a un blanco de tiro. ? No est relacionada con una exposicin al sol, est enrojecida y duele, y produce descamacin de la piel.  Esta informacin no tiene Theme park managercomo fin reemplazar el consejo del mdico. Asegrese de hacerle al mdico cualquier pregunta que tenga. Document Released: 02/04/2005 Document Revised: 08/19/2015 Document Reviewed: 09/12/2014 Elsevier Interactive Patient Education  2017 ArvinMeritorElsevier Inc.   IF  you received an x-ray today, you will receive an [facility]. Please contact [facility] at [phone] with questions or concerns regarding your invoice.   IF you received labwork today, you will receive an [facility]. Please contact [facility] at [phone] with questions or concerns regarding your invoice.   Our billing staff will not be able to assist you with questions regarding bills from these companies.  You will be contacted with the lab results as soon as they are available. The fastest way to get your results is to activate your My Chart account. Instructions are located on the last page of this paperwork. If you have not heard from us regarding the results in 2 weeks, please contact this office.

## 2017-04-20 NOTE — Progress Notes (Signed)
Subjective:    Patient ID: [name], female    DOB: 04/28/1976, 41 y.o.   MRN: [id number]  HPI [name] is a 41 y.o. female  Seen 7 days ago by Dr. Alvy Bimler. At that time had diffuse rash, treat his possible allergic reaction without known cause. Macular papular rash over the trunk and extremities. Received prednisone 40 mg daily 5 days, Benadryl over-the-counter recommended.  Feels like rash is same as last visit, but more areas. Very itchy. . Did not notice significant change with prednisone. Had eaten some raw dough and undercooked bread before initial sx's. Ate chicken flautas last night and more rash on legs this am. No new meds, soap,  derm products, detergent or fabric softener.   No difficulty swallowing, no difficulty breathing. No oral or vaginal involvement. No recent illness, including no sore throat. No known sick contacts.   Tx: apple cider vinegar. Benadryl 2 pills every 6 hours- none yet today. Minimal improvement. Cortisone cream - few times.   Previous medical records/provider note reviewed.  Patient Active Problem List   Diagnosis Date Noted  . Acute allergic reaction 04/13/2017  . Rash and nonspecific skin eruption 04/13/2017  . BPPV (benign paroxysmal positional vertigo), right 11/25/2016  . Adjustment disorder with mixed anxiety and depressed mood 07/20/2016  . Chronic fatigue 07/20/2016   Past Medical History:  Diagnosis Date  . Anxiety   . BPPV (benign paroxysmal positional vertigo)   . Depression   . Hemorrhoid   . No pertinent past medical history   . Postpartum care and examination of lactating mother 11/20/2010  . Postpartum care following vaginal delivery (4/22) 08/31/2014   Past Surgical History:  Procedure Laterality Date  . CERVICAL CERCLAGE    . CERVICAL CERCLAGE N/A 02/21/2014   Procedure: Modified McDonald CERVICAL CERCLAGE ;  Surgeon: Genia Del, MD;  Location: [facility];  Service: Gynecology;  Laterality:  N/A;  EDD: 09/05/14  . EVALUATION UNDER ANESTHESIA WITH HEMORRHOIDECTOMY N/A 01/28/2017   Procedure: EXAM UNDER ANESTHESIA WITH HEMORRHOIDECTOMY;  Surgeon: Abigail Miyamoto, MD;  Location: [facility];  Service: General;  Laterality: N/A;  . HEMORRHOID SURGERY     after delivery  . LAPAROSCOPIC TUBAL LIGATION Bilateral 09/11/2014   Procedure: LAPAROSCOPIC TUBAL LIGATION With Cautery;  Surgeon: Genia Del, MD;  Location: [facility];  Service: Gynecology;  Laterality: Bilateral;  . TUBAL LIGATION     No Known Allergies Prior to Admission medications   Medication Sig Start Date End Date Taking? Authorizing Provider  meclizine (ANTIVERT) 32 MG tablet Take 32 mg by mouth 3 (three) times daily as needed.    [provider]   Social History   Socioeconomic History  . Marital status: Married    Spouse name: Not on file  . Number of children: Not on file  . Years of education: Not on file  . Highest education level: Not on file  Social Needs  . Financial resource strain: Not on file  . Food insecurity - worry: Not on file  . Food insecurity - inability: Not on file  . Transportation needs - medical: Not on file  . Transportation needs - non-medical: Not on file  Occupational History  . Not on file  Tobacco Use  . Smoking status: Never Smoker  . Smokeless tobacco: Never Used  Substance and Sexual Activity  . Alcohol use: Yes    Comment: social  . Drug use: No  . Sexual activity: Yes    Birth  control/protection: Surgical    Comment: BTL  Other Topics Concern  . Not on file  Social History Narrative  . Not on file       Review of Systems  Constitutional: Negative for fever.  HENT: Negative for mouth sores and trouble swallowing.   Respiratory: Negative for cough, choking, shortness of breath and stridor.   Genitourinary: Negative for genital sores.  Skin: Positive for rash.  Allergic/Immunologic: Negative for environmental allergies and food allergies  (none known.).       Objective:   Physical Exam  Constitutional: She is oriented to person, place, and time. She appears well-developed and well-nourished. No distress.  HENT:  Head: Normocephalic and atraumatic.  Right Ear: Hearing, tympanic membrane, external ear and ear canal normal.  Left Ear: Hearing, tympanic membrane, external ear and ear canal normal.  Nose: Nose normal.  Mouth/Throat: Oropharynx is clear and moist. No oropharyngeal exudate.  Eyes: Conjunctivae and EOM are normal. Pupils are equal, round, and reactive to light.  Cardiovascular: Normal rate, regular rhythm, normal heart sounds and intact distal pulses.  No murmur heard. Pulmonary/Chest: Effort normal and breath sounds normal. No respiratory distress. She has no wheezes. She has no rhonchi.  Neurological: She is alert and oriented to person, place, and time.  Skin: Skin is warm and dry. Rash (Diffuse maculopapular rash with slight raised areas across back, abdomen, upper chest wall, medial thighs. Few dry appearing areas on the abdominal wall, but states that's where she placed vinegar.) noted.  Psychiatric: She has a normal mood and affect. Her behavior is normal.  Vitals reviewed.  Vitals:   04/20/17 1337  BP: 110/72  Pulse: 95  Resp: 16  Temp: 99 F (37.2 C)  TempSrc: Oral  SpO2: 99%  Weight: 216 lb (98 kg)  Height: 5\' 7"  (1.702 m)   Results for orders placed or performed in visit on 04/20/17  POCT glucose (manual entry)  Result Value Ref Range   POC Glucose 85 70 - 99 mg/dl       Assessment & Plan:    [name] is a 41 y.o. female Allergic urticaria - Plan: Ambulatory referral to Allergy, predniSONE (DELTASONE) 20 MG tablet, hydrOXYzine (ATARAX/VISTARIL) 25 MG tablet  Rash and nonspecific skin eruption - Plan: predniSONE (DELTASONE) 20 MG tablet, hydrOXYzine (ATARAX/VISTARIL) 25 MG tablet  Screening for diabetes mellitus - Plan: POCT glucose (manual entry)  Pruritus - Plan:  hydrOXYzine (ATARAX/VISTARIL) 25 MG tablet  Persistent rash, with spread to upper thighs.  Primarily pruritic, still appears to be allergic cause/urticaria. Slightly dry lesions on the abdominal wall may be different appearance due to vinegar treatment. No oral lesions, no vaginal lesions, no respiratory or throat symptoms, no systemic symptoms.  -Glucose okay, will restart prednisone at higher dose of 60 mg daily with taper. Tolerated previous prednisone treatment.  -Vistaril 25-50 mg every 6 hours when necessary itching. Okay to continue topical cortisone cream if needed, hold on further vinegar treatment.  -Refer to allergist, but in the meantime would like to recheck symptoms in 48 hours given recent progression. RTC/ER precautions if worsening Meds ordered this encounter  Medications  . predniSONE (DELTASONE) 20 MG tablet    Sig: 3 by mouth for 3 days, then 2 by mouth for 2 days, then 1 by mouth for 2 days, then 1/2 by mouth for 2 days.    Dispense:  16 tablet    Refill:  0  . hydrOXYzine (ATARAX/VISTARIL) 25 MG tablet    Sig:  Take 1-2 tablets (25-50 mg total) by mouth every 6 (six) hours as needed for itching.    Dispense:  30 tablet    Refill:  0   Patient Instructions   Hydroxyzine 1-2 cada 6 horas si necesario.  Prednisone - 3 pastillas hoy.  Hydrocortisone 2 veces cada dia si necesario.  regrese en dos dias con Dr. Janee MornSagardia o Mindel Friscia  regrese aqui o cuarto de emergencia si empeorse   Ronchas (Hives) Las ronchas (urticaria) son reas enrojecidas e hinchadas en la piel que ocasionan picazn. Las ronchas pueden aparecer en cualquier parte del cuerpo y variar en tamao. Pueden ser del tamao de la punta de un bolgrafo o mucho ms grandes. Las ronchas suelen mejorar en el transcurso de 24horas (ronchas Jeromeagudas). En otros casos, despus de que desaparecen, aparecen ronchas nuevas. Este ciclo puede prolongarse durante varios das o semanas (ronchas crnicas). Las ronchas aparecen  como consecuencia de una reaccin del cuerpo a un agente irritante o a algo a lo que usted es alrgico (factor desencadenante). Cuando se expone a un factor desencadenante, su cuerpo libera una sustancia qumica (histamina) que causa enrojecimiento, picazn e hinchazn. Las ronchas se pueden formar inmediatamente despus de exponerse a un factor desencadenante o bien al cabo de varias horas. No se transmiten de persona a persona (no son contagiosas). Las ronchas pueden empeorar al rascarse, Radio producerhacer ejercicios y por estrs emocional. CAUSAS Las causas de esta afeccin incluyen lo siguiente:  Alergias a determinadas comidas o ingredientes.  Las picaduras o mordeduras de insectos.  Exposicin al polen o a la caspa de las [address].  Contacto con el ltex o con sustancias qumicas.  Exposicin a la luz del sol, al calor o al fro.  Actividad fsica.  Estrs. Algunas enfermedades y tratamientos pueden ocasionar ronchas. Estos incluyen los siguientes:  Virus, incluido el virus de la gripe.  Infecciones bacterianas, como infecciones en las vas urinarias y Chief Executive Officeramigdalitis estreptoccica.  Trastornos, como la vasculitis, el lupus o la enfermedad tiroidea.  Determinados medicamentos.  Vacunas contra la Programmer, multimediaalergia.  Transfusiones de [place]. Algunas veces, se desconocen los factores que causan las ronchas (ronchas idiopticas). FACTORES DE RIESGO Es ms probable que esta afeccin se manifieste en:  Mujeres.  Personas que padecen Duke Energyalergias alimentarias, en especial a los ctricos, la [place], los Baringhuevos, el man, los frutos secos o los mariscos.  Personas que son alrgicas a lo siguiente: ? Medicamentos. ? Ltex. ? Insectos. ? Animales. ? Polen.  Personas que padecen ciertas afecciones, como el lupus o la enfermedad tiroidea. SNTOMAS El principal sntoma de esta afeccin es la presencia de parches o pequeos bultos elevados de color rojo o blanco en la piel que ocasionan picazn. Estas  reas:  Pueden convertirse en ronchas grandes e inflamadas.  Pueden cambiar de forma y Czech Republicubicacin de manera rpida y repetida.  Pueden aparecer en forma de ronchas separadas o ronchas que se conectan y abarcan una zona extensa de la piel.  Pueden causar escozor o volverse dolorosas.  Pueden volverse blancas (palidecer) al presionar en el centro. En casos graves, las Powellmanos, los pies y el rostro tambin pueden hincharse. Esto podra ocurrir si las ronchas se desarrollan ms profundamente en la piel. DIAGNSTICO Esta afeccin se diagnostica en funcin de los sntomas, los antecedentes mdicos y un examen fsico. Podran realizarle pruebas de [place]angre, Com[place]orina o piel para descubrir qu es lo que causa las ronchas y Sales promotion account executivedescartar otros problemas de Freight forwardersalud. El mdico tambin podra tomar una pequea Luxembourgmuestra de piel del rea  afectada y analizarla con un microscopio (biopsia). TRATAMIENTO El tratamiento depende de la gravedad de la enfermedad. El mdico podra recomendarle aplicar paos mojados con agua fra (compresas fras) o tomar duchas con agua fra para Associate Professor. A veces, las ronchas se tratan con medicamentos, como por ejemplo:  Antihistamnicos.  Corticoides.  Antibiticos.  Medicamentos inyectables (omalizumab). El mdico podra recetar esto si usted presenta ronchas idiopticas y contina teniendo sntomas aun despus del tratamiento con antihistamnicos. En los casos ms graves, podra ser necesario aplicar una inyeccin de adrenalina (epinefrina) para prevenir una reaccin alrgica potencialmente mortal (anafilaxis). INSTRUCCIONES PARA EL CUIDADO EN EL HOGAR Medicamentos  [place]ome o aplquese los medicamentos de venta libre y recetados solamente como se lo haya indicado el mdico.  Si le recetaron un antibitico, tmelo como se lo haya indicado el mdico. No deje de tomar los antibiticos aunque comience a Actor. Cuidado de la piel  Aplique compresas fras en las zonas  afectadas.  No se rasque ni se refriegue la piel. Instrucciones generales  No se duche ni tome baos de inmersin con agua caliente. Podra empeorar la picazn.  No use ropa ajustada.  Use pantalla solar y ropa protectora cuando est al [place].  Evite las sustancias que causan las ronchas. Lleve un registro para Presenter, broadcasting de aquello que le ocasiona ronchas. Escriba los siguientes datos: ? Los medicamentos que toma. ? Lo que usted come y bebe. ? Los productos que [place] en la piel.  Concurra a todas las visitas de control como se lo haya indicado el mdico. Esto es importante. SOLICITE ATENCIN MDICA SI:  Los sntomas no se [employer].  Le duelen las articulaciones o estas se inflaman.  SOLICITE ATENCIN MDICA DE INMEDIATO SI:  Tiene fiebre.  Siente dolor en el abdomen.  Tiene la lengua o los labios hinchados.  Tiene los prpados hinchados.  Siente el pecho o la garganta cerrados.  Tiene problemas para respirar o tragar. Estos sntomas pueden representar un problema grave que constituye Radio broadcast assistant. No espere hasta que los sntomas desaparezcan. Solicite atencin mdica de inmediato. Comunquese con el servicio de emergencias de su localidad (911 en los [place]). No conduzca por sus propios medios [employer]orporated. Esta informacin no tiene Theme park manager el consejo del mdico. Asegrese de hacerle al mdico cualquier pregunta que tenga. Document Released: 04/27/2005 Document Revised: 08/19/2015 Document Reviewed: 02/13/2015 Elsevier Interactive Patient Education  2017 Elsevier Inc.  Erupcin cutnea (Rash) Una erupcin cutnea es un cambio en el color de la piel. Una erupcin tambin puede cambiar la forma en que se siente la piel. Hay muchas afecciones y [employer] Airlines que pueden causar una erupcin. INSTRUCCIONES PARA EL CUIDADO EN EL HOGAR Est atento a cualquier cambio en los sntomas. Estas indicaciones pueden  ayudarlo con el trastorno: Intel Corporation o aplquese los medicamentos de venta libre y Building control surveyor como se lo haya indicado el mdico. Estos pueden incluir lo siguiente:  Crema con corticoides.  Lociones para Associate Professor.  Antihistamnicos por va oral. Cuidado de la piel  Aplique compresas fras en las zonas afectadas.  Trate de tomar un bao con lo siguiente: ? Sales de [place]. Siga las instrucciones del envase. Puede conseguirlas en la tienda de comestibles o la farmacia local. ? Bicarbonato de sodio. Vierta un poco en la baera como se lo haya indicado el mdico. ? Avena coloidal. Siga las instrucciones del envase. Puede conseguirla en la tienda de comestibles o la  farmacia local.  Intente colocarse una pasta de bicarbonato de [employer]. Agregue agua al bicarbonato hasta que tenga la consistencia de una pasta.  No se rasque ni se refriegue la piel.  Evite cubrir la erupcin. Asegrese de que la erupcin est expuesta al aire todo lo posible. Instrucciones generales  Evite los baos de inmersin y las duchas calientes, que pueden empeorar la picazn. Neomia Dear ducha fra puede aliviar.  Evite los detergentes y los jabones perfumados, y los perfumes. Utilice jabones, detergentes, perfumes y cosmticos suaves.  Evite las sustancias que causan la erupcin. Lleve un diario como ayuda para registrar lo que le causa erupcin. Escriba los siguientes datos: ? Lo que come. ? Los cosmticos que [place]. ? Lo que bebe. ? La ropa que [place]. Esto incluye las alhajas.  Concurra a todas las visitas de control como se lo haya indicado el mdico. Esto es importante. SOLICITE ATENCIN MDICA SI:  Berenice Primas de noche.  Pierde peso.  Orina ms de lo normal.  Se siente dbil.  Vomita.  Tiene un color amarillo en la piel o en la zona blanca del ojo (ictericia).  La piel: ? Siente hormigueos. ? Se adormece.  La erupcin: ? No desaparece despus de [employer] Corporation. ? Empeora.  Usted: ? Est inusualmente sediento. ? Est ms cansado que lo habitual.  Tiene los siguientes sntomas: ? Sntomas nuevos. ? Dolor en el abdomen. ? Fiebre. ? Diarrea.  SOLICITE ATENCIN MDICA DE INMEDIATO SI:  Presenta una erupcin que cubre todo el cuerpo o la mayor parte de [place]. La erupcin puede ser dolorosa o no.  Tiene ampollas que tienen las siguientes caractersticas: ? Se ubican sobre la erupcin. ? Se agrandan o crecen juntas. ? Son dolorosas. ? Estn dentro de la nariz o la boca.  Le aparece una erupcin que tiene las siguientes caractersticas: ? Tiene pequeas manchas moradas, como si fueran pinchazos, en todo el cuerpo. ? Tiene un aspecto parecido a Racheal Patches o a un blanco de tiro. ? No est relacionada con una exposicin al sol, est enrojecida y duele, y produce descamacin de la piel.  Esta informacin no tiene Theme park manager el consejo del mdico. Asegrese de hacerle al mdico cualquier pregunta que tenga. Document Released: 02/04/2005 Document Revised: 08/19/2015 Document Reviewed: 09/12/2014 Elsevier Interactive Patient Education  2017 [employer]vin[employer].   IF you received an x-ray today, you will receive an invoice from [facility]. Please contact [facility] at [phone] with questions or concerns regarding your invoice.   IF you received labwork today, you will receive an invoice from [facility]. Please contact [facility] at [phone] with questions or concerns regarding your invoice.   Our billing staff will not be able to assist you with questions regarding bills from these companies.  You will be contacted with the lab results as soon as they are available. The fastest way to get your results is to activate your My Chart account. Instructions are located on the last page of this paperwork. If you have not heard from [place] regarding the results in 2 weeks, please contact this office.     Signed,   Meredith Staggers, MD Primary Care at [facility].  04/20/17 2:46 PM

## 2017-04-21 ENCOUNTER — Encounter: Payer: Self-pay | Admitting: Family Medicine

## 2017-04-23 ENCOUNTER — Ambulatory Visit: Payer: Self-pay | Admitting: Emergency Medicine

## 2017-04-27 ENCOUNTER — Ambulatory Visit: Payer: Self-pay | Admitting: Emergency Medicine

## 2017-04-27 ENCOUNTER — Other Ambulatory Visit: Payer: Self-pay

## 2017-04-27 ENCOUNTER — Encounter: Payer: Self-pay | Admitting: Emergency Medicine

## 2017-04-27 VITALS — BP 100/60 | HR 98 | Temp 98.2°F | Resp 16 | Ht 66.25 in | Wt 212.8 lb

## 2017-04-27 DIAGNOSIS — L500 Allergic urticaria: Secondary | ICD-10-CM

## 2017-04-27 NOTE — Patient Instructions (Addendum)
IF you received an x-ray today, you will [facility] an [facility]. Please contact [facility] at [phone] with questions or concerns regarding your invoice.   IF you received labwork today, you will receive an invoice from [facility]. Please contact [facility] at [phone] with questions or concerns regarding your invoice.   Our billing staff will not be able to assist you with questions regarding bills from these companies.  You will be contacted with the lab results as soon as they are available. The fastest way to get your results is to activate your My Chart account. Instructions are located on the last page of this paperwork. If you have not heard from us regarding the results in 2 weeks, please contact this office.     Erupcin cutnea (Rash) Una erupcin cutnea es un cambio en el color de la piel. Una erupcin tambin puede cambiar la forma en que se siente la piel. Hay muchas afecciones y Continental Airlinesfactores diferentes que pueden causar una erupcin. CUIDADOS EN EL HOGAR Est atento a cualquier cambio en los sntomas. Estas indicaciones pueden ayudarlo con el trastorno: Intel CorporationMedicamentos Tome o Energy East Corporationaplique los medicamentos de venta libre y los recetados solamente como se lo haya indicado el mdico. Estos pueden incluir lo siguiente:  Betha LoaCrema con corticoides.  Lociones para Associate Professoraliviar la picazn.  Antihistamnicos por va oral. Cuidado de la piel  Coloque compresas fras en las zonas afectadas.  Trate de tomar un bao con lo siguiente: ? Sales de [place]. Siga las instrucciones del envase. Puede conseguirlas en la tienda de comestibles o la farmacia local. ? Bicarbonato de sodio. Vierta un poco en la baera como se lo haya indicado el mdico. ? Avena coloidal. Siga las instrucciones del envase. Puede conseguirla en la tienda de comestibles o la farmacia local.  Intente colocarse una pasta de bicarbonato de sodio sobre la piel. Agregue agua al bicarbonato de sodio  hasta que se forme una pasta.  No se rasque ni se refriegue la piel.  Evite cubrir la erupcin. Asegrese de que la erupcin est expuesta al aire todo lo posible. Instrucciones generales  Evite los baos de inmersin y las duchas calientes ya que pueden empeorar la picazn. Neomia DearUna ducha fra puede Acupuncturistaliviar el malestar.  Evite los detergentes y los jabones perfumados, y los perfumes. Utilice jabones, detergentes, perfumes y cosmticos suaves.  Evite todo lo que le cause erupcin cutnea. Lleve un diario como ayuda para registrar lo que le causa erupcin. Escriba los siguientes datos: ? Lo que come. ? Los cosmticos que [place] ([place]) Islandsutiliza. ? Lo que bebe. ? La ropa que Bots[place]ausa. Esto incluye las alhajas.  Concurra a todas las visitas de control como se lo haya indicado el mdico. Esto es importante. SOLICITE AYUDA SI:  Transpira de noche.  Pierde peso.  Orina ms de lo normal.  Se siente dbil.  Vomita.  Tiene un color amarillo en la piel o en la zona blanca del ojo (ictericia).  La piel: ? Siente hormigueos. ? Se adormece.  La erupcin cutnea: ? No desaparece despus de The Mutual of Omahaunos das. ? Empeora.  Usted: ? Est ms sediento que lo habitual. ? Est ms cansado que lo habitual.  Tiene los siguientes sntomas: ? Sntomas nuevos. ? Dolor en el vientre (abdomen). ? Fiebre. ? Deposiciones lquidas (diarrea). SOLICITE AYUDA DE INMEDIATO SI:  La erupcin cubre todo el cuerpo o la mayor parte de [place]. La erupcin puede ser dolorosa o no.  Tiene ampollas con las siguientes caractersticas: ? ColombiaSe ubican  sobre la erupcin. ? Se agrandan. ? Crecen juntas. ? Son dolorosas. ? Estn dentro de la nariz o la boca.  Tiene una erupcin cutnea con estas caractersticas: ? Tiene pequeas manchas moradas, como si fueran pinchazos, en todo el cuerpo. ? Tiene un aspecto parecido a Racheal Patchesuna diana o a un blanco de tiro. ? Est enrojecida y le duele, le produce descamacin de la piel y no se relaciona con haber  estado mucho tiempo bajo el sol. Esta informacin no tiene Theme park managercomo fin reemplazar el consejo del mdico. Asegrese de hacerle al mdico cualquier pregunta que tenga. Document Released: 07/24/2008 Document Revised: 08/19/2015 Document Reviewed: 09/12/2014 Elsevier Interactive Patient Education  2018 Arvin[employer]vier [employer].

## 2017-04-27 NOTE — Progress Notes (Signed)
[name] 41 y.o.   Chief Complaint  Patient presents with  . Follow-up    rash on body     HISTORY OF PRESENT ILLNESS: This is a 41 y.o. female complaining of persistent itchy rash x 2 weeks at least; seen by me 12/4 and then again by Dr. Neva Wu 12/11; started on Prednisone both times; itching improved; rash waxes and wanes.   Rash  This is a chronic problem. The current episode started 1 to 4 weeks ago. The problem has been waxing and waning since onset. The affected locations include the neck, chest, torso, back and abdomen (upper and lower extremities). The rash is characterized by redness and itchiness. She was exposed to nothing. Pertinent negatives include no anorexia, congestion, cough, diarrhea, eye pain, facial edema, fatigue, fever, joint pain, nail changes, rhinorrhea, shortness of breath, sore throat or vomiting. Past treatments include antihistamine, topical steroids and oral steroids. The treatment provided mild relief. There is no history of asthma.     Prior to Admission medications   Medication Sig Start Date End Date Taking? Authorizing Provider  meclizine (ANTIVERT) 32 MG tablet Take 32 mg by mouth 3 (three) times daily as needed.   Yes [provider]  hydrOXYzine (ATARAX/VISTARIL) 25 MG tablet Take 1-2 tablets (25-50 mg total) by mouth every 6 (six) hours as needed for itching. Patient not taking: Reported on 04/27/2017 04/20/17   Stacey Flood, MD  predniSONE (DELTASONE) 20 MG tablet 3 by mouth for 3 days, then 2 by mouth for 2 days, then 1 by mouth for 2 days, then 1/2 by mouth for 2 days. Patient not taking: Reported on 04/27/2017 04/20/17   Stacey Flood, MD    Allergies  Allergen Reactions  . Penicillins Other (See Comments)    childhood    Patient Active Problem List   Diagnosis Date Noted  . Acute allergic reaction 04/13/2017  . Rash and nonspecific skin eruption 04/13/2017  . BPPV (benign paroxysmal positional  vertigo), right 11/25/2016  . Adjustment disorder with mixed anxiety and depressed mood 07/20/2016  . Chronic fatigue 07/20/2016    Past Medical History:  Diagnosis Date  . Anxiety   . BPPV (benign paroxysmal positional vertigo)   . Depression   . Hemorrhoid   . No pertinent past medical history   . Postpartum care and examination of lactating mother 11/20/2010  . Postpartum care following vaginal delivery (4/22) 08/31/2014    Past Surgical History:  Procedure Laterality Date  . CERVICAL CERCLAGE    . CERVICAL CERCLAGE N/A 02/21/2014   Procedure: Modified McDonald CERVICAL CERCLAGE ;  Surgeon: Stacey Del, MD;  Location: [facility];  Service: Gynecology;  Laterality: N/A;  EDD: 09/05/14  . EVALUATION UNDER ANESTHESIA WITH HEMORRHOIDECTOMY N/A 01/28/2017   Procedure: EXAM UNDER ANESTHESIA WITH HEMORRHOIDECTOMY;  Surgeon: Stacey Miyamoto, MD;  Location: [facility];  Service: General;  Laterality: N/A;  . HEMORRHOID SURGERY     after delivery  . LAPAROSCOPIC TUBAL LIGATION Bilateral 09/11/2014   Procedure: LAPAROSCOPIC TUBAL LIGATION With Cautery;  Surgeon: Stacey Del, MD;  Location: [facility];  Service: Gynecology;  Laterality: Bilateral;  . TUBAL LIGATION      Social History   Socioeconomic History  . Marital status: Married    Spouse name: Not on file  . Number of children: Not on file  . Years of education: Not on file  . Highest education level: Not on file  Social Needs  . Financial resource strain: Not  on file  . Food insecurity - worry: Not on file  . Food insecurity - inability: Not on file  . Transportation needs - medical: Not on file  . Transportation needs - non-medical: Not on file  Occupational History  . Not on file  Tobacco Use  . Smoking status: Never Smoker  . Smokeless tobacco: Never Used  Substance and Sexual Activity  . Alcohol use: Yes    Comment: social  . Drug use: No  . Sexual activity: Yes    Birth control/protection:  Surgical    Comment: BTL  Other Topics Concern  . Not on file  Social History Narrative  . Not on file    Family History  Problem Relation Age of Onset  . Heart disease Mother   . Hyperlipidemia Mother      Review of Systems  Constitutional: Negative for chills, fatigue and fever.  HENT: Negative.  Negative for congestion, rhinorrhea and sore throat.   Eyes: Negative.  Negative for pain.  Respiratory: Negative for cough, shortness of breath and wheezing.   Cardiovascular: Negative.  Negative for chest pain and palpitations.  Gastrointestinal: Negative.  Negative for anorexia, diarrhea and vomiting.  Genitourinary: Negative.  Negative for dysuria and hematuria.  Musculoskeletal: Negative.  Negative for joint pain.  Skin: Positive for itching and rash. Negative for nail changes.  Neurological: Negative.  Negative for dizziness and headaches.  Endo/Heme/Allergies: Negative.   All other systems reviewed and are negative.  Vitals:   04/27/17 0958  BP: 100/60  Pulse: 98  Resp: 16  Temp: 98.2 F (36.8 C)  SpO2: 98%     Physical Exam  Constitutional: She is oriented to person, place, and time. She appears well-developed and well-nourished.  HENT:  Head: Normocephalic.  Right Ear: External ear normal.  Left Ear: External ear normal.  Nose: Nose normal.  Mouth/Throat: Oropharynx is clear and moist.  Eyes: Conjunctivae and EOM are normal. Pupils are equal, round, and reactive to light.  Neck: Normal range of motion. Neck supple.  Cardiovascular: Normal rate, regular rhythm and normal heart sounds.  Pulmonary/Chest: Effort normal and breath sounds normal.  Abdominal: Soft. Bowel sounds are normal. She exhibits no distension. There is no tenderness.  Musculoskeletal: Normal range of motion.  Neurological: She is alert and oriented to person, place, and time. No sensory deficit. She exhibits normal muscle tone. Coordination normal.  Skin: Skin is warm and dry. Capillary  refill takes less than 2 seconds. Rash noted.  maculo-papular rash to trunk and extremities; no rash in face or palms of hands.  Psychiatric: She has a normal mood and affect. Her behavior is normal.  Vitals reviewed.    ASSESSMENT & PLAN: Leshia was seen today for follow-up.  Diagnoses and all orders for this visit:  Allergic urticaria -     CBC with Differential/Platelet -     Comprehensive metabolic panel -     Ambulatory referral to Allergy    Patient Instructions       IF you received an x-ray today, you will receive an invoice from [facility]. Please contact [facility] at [phone] with questions or concerns regarding your invoice.   IF you received labwork today, you will receive an invoice from [facility]. Please contact [facility] at [phone] with questions or concerns regarding your invoice.   Our billing staff will not be able to assist you with questions regarding bills from these companies.  You will be contacted with the lab results as soon as  they are available. The fastest way to get your results is to activate your My Chart account. Instructions are located on the last page of this paperwork. If you have not heard from [place] regarding the results in 2 weeks, please contact this office.     Erupcin cutnea (Rash) Una erupcin cutnea es un cambio en el color de la piel. Una erupcin tambin puede cambiar la forma en que se siente la piel. Hay muchas afecciones y [employer] que pueden causar una erupcin. CUIDADOS EN EL HOGAR Est atento a cualquier cambio en los sntomas. Estas indicaciones pueden ayudarlo con el trastorno: [employer] o [employer] medicamentos de venta libre y los recetados solamente como se lo haya indicado el mdico. Estos pueden incluir lo siguiente:  Betha Loa con corticoides.  Lociones para Associate Professor.  Antihistamnicos por va oral. Cuidado de la piel  Coloque compresas fras en las zonas  afectadas.  Trate de tomar un bao con lo siguiente: ? Sales de [place]. Siga las instrucciones Wu envase. Puede conseguirlas en la tienda de comestibles o la farmacia local. ? Bicarbonato de sodio. Vierta un poco en la baera como se lo haya indicado el mdico. ? Avena coloidal. Siga las instrucciones Wu envase. Puede conseguirla en la tienda de comestibles o la farmacia local.  Intente colocarse una pasta de bicarbonato de sodio sobre la piel. Agregue agua al bicarbonato de sodio hasta que se forme una pasta.  No se rasque ni se refriegue la piel.  Evite cubrir la erupcin. Asegrese de que la erupcin est expuesta al aire todo lo posible. Instrucciones generales  Evite los baos de inmersin y las duchas calientes ya que pueden empeorar la picazn. Neomia Dear ducha fra puede Acupuncturist.  Evite los detergentes y los jabones perfumados, y los perfumes. Utilice jabones, detergentes, perfumes y cosmticos suaves.  Evite todo lo que le cause erupcin cutnea. Lleve un diario como ayuda para registrar lo que le causa erupcin. Escriba los siguientes datos: ? Lo que come. ? Los cosmticos que [place]. ? Lo que bebe. ? La ropa que [place]. Esto incluye las alhajas.  Concurra a todas las visitas de control como se lo haya indicado el mdico. Esto es importante. SOLICITE AYUDA SI:  Transpira de noche.  Pierde peso.  Orina ms de lo normal.  Se siente dbil.  Vomita.  Tiene un color amarillo en la piel o en la zona blanca Wu ojo (ictericia).  La piel: ? Siente hormigueos. ? Se adormece.  La erupcin cutnea: ? No desaparece despus de [employer]. ? [place].  Usted: ? Est ms sediento que lo habitual. ? Est ms cansado que lo habitual.  Tiene los siguientes sntomas: ? Sntomas nuevos. ? Dolor en el vientre (abdomen). ? Fiebre. ? Deposiciones lquidas (diarrea). SOLICITE AYUDA DE INMEDIATO SI:  La erupcin cubre todo el cuerpo o la mayor parte de [place]. La erupcin  puede ser dolorosa o no.  Tiene ampollas con las siguientes caractersticas: ? Se ubican sobre la erupcin. ? Se agrandan. ? Crecen juntas. ? Son dolorosas. ? Estn dentro de la nariz o la boca.  Tiene una erupcin cutnea con estas caractersticas: ? Tiene pequeas manchas moradas, como si fueran pinchazos, en todo el cuerpo. ? Tiene un aspecto parecido a Racheal Patches o a un blanco de tiro. ? Est enrojecida y le duele, le produce descamacin de la piel y no se relaciona con haber estado mucho tiempo bajo el sol. Esta informacin no tiene como fin  reemplazar el consejo Wu mdico. Asegrese de hacerle al mdico cualquier pregunta que tenga. Document Released: 07/24/2008 Document Revised: 08/19/2015 Document Reviewed: 09/12/2014 Elsevier Interactive Patient Education  2018 Elsevier Inc.      Edwina BarthMiguel Kaveh Kissinger, MD [facility]

## 2017-04-28 ENCOUNTER — Encounter: Payer: Self-pay | Admitting: Radiology

## 2017-04-28 LAB — CBC WITH DIFFERENTIAL/PLATELET
BASOS: 0 %
Basophils Absolute: 0 10*3/uL (ref 0.0–0.2)
EOS (ABSOLUTE): 0.1 10*3/uL (ref 0.0–0.4)
EOS: 1 %
HEMATOCRIT: 34.6 % (ref 34.0–46.6)
Hemoglobin: 11.2 g/dL (ref 11.1–15.9)
IMMATURE GRANS (ABS): 0 10*3/uL (ref 0.0–0.1)
IMMATURE GRANULOCYTES: 0 %
Lymphocytes Absolute: 3.8 10*3/uL — ABNORMAL HIGH (ref 0.7–3.1)
Lymphs: 47 %
MCH: 26.6 pg (ref 26.6–33.0)
MCHC: 32.4 g/dL (ref 31.5–35.7)
MCV: 82 fL (ref 79–97)
MONOS ABS: 0.8 10*3/uL (ref 0.1–0.9)
Monocytes: 10 %
NEUTROS PCT: 42 %
Neutrophils Absolute: 3.3 10*3/uL (ref 1.4–7.0)
Platelets: 384 10*3/uL — ABNORMAL HIGH (ref 150–379)
RBC: 4.21 x10E6/uL (ref 3.77–5.28)
RDW: 15.6 % — AB (ref 12.3–15.4)
WBC: 7.9 10*3/uL (ref 3.4–10.8)

## 2017-04-28 LAB — COMPREHENSIVE METABOLIC PANEL
A/G RATIO: 1.8 (ref 1.2–2.2)
ALT: 27 IU/L (ref 0–32)
AST: 18 IU/L (ref 0–40)
Albumin: 4.3 g/dL (ref 3.5–5.5)
Alkaline Phosphatase: 69 IU/L (ref 39–117)
BUN / CREAT RATIO: 27 — AB (ref 9–23)
BUN: 16 mg/dL (ref 6–24)
Bilirubin Total: <0.2 mg/dL (ref 0.0–1.2)
CALCIUM: 9.3 mg/dL (ref 8.7–10.2)
CO2: 27 mmol/L (ref 20–29)
Chloride: 104 mmol/L (ref 96–106)
Creatinine, Ser: 0.59 mg/dL (ref 0.57–1.00)
GFR, EST AFRICAN AMERICAN: 133 mL/min/{1.73_m2} (ref >59)
GFR, EST NON AFRICAN AMERICAN: 115 mL/min/{1.73_m2} (ref >59)
GLOBULIN, TOTAL: 2.4 g/dL (ref 1.5–4.5)
Glucose: 77 mg/dL (ref 65–99)
POTASSIUM: 4.5 mmol/L (ref 3.5–5.2)
SODIUM: 143 mmol/L (ref 134–144)
TOTAL PROTEIN: 6.7 g/dL (ref 6.0–8.5)

## 2018-01-01 IMAGING — CT CT HEAD W/O CM
5 of 8 series · 18 of 47 positions shown, 19 images · non-contrast
Comparison: None.

CLINICAL DATA: Right-sided headache, dizziness, and bilateral hand
weakness since 06/26/2016.

EXAM:
CT HEAD WITHOUT CONTRAST
CT CERVICAL SPINE WITHOUT CONTRAST
TECHNIQUE: Multidetector CT imaging of the head and cervical spine was
performed following the standard protocol without intravenous
contrast. Multiplanar CT image reconstructions of the cervical spine
were also generated.

[Series 3: head wo · axial · 0.42mm/px · z∈[+1690,+1844]mm · 3 of 32 slices shown, 4 images]
[im 1/32  brain]
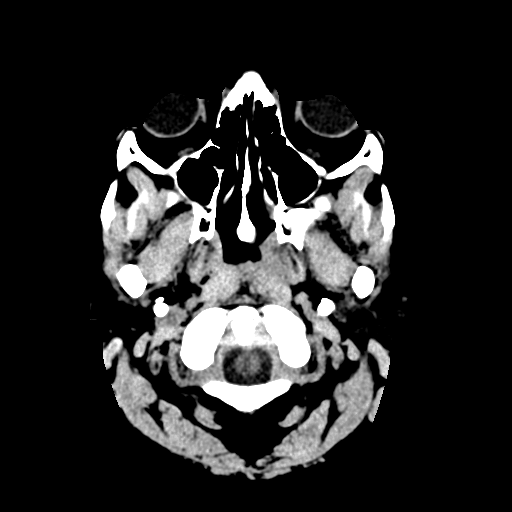
[im 1/32  bone]
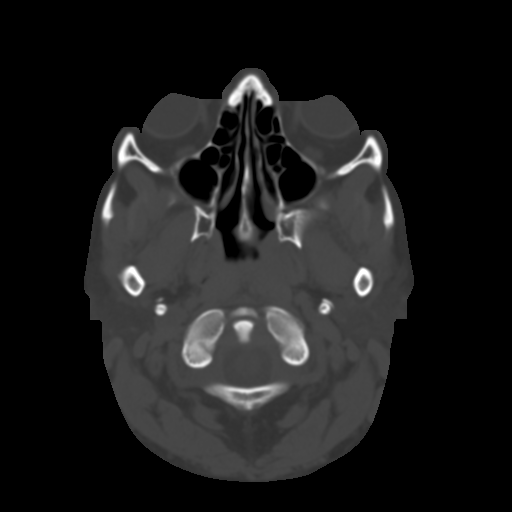
[im 16/32  brain]
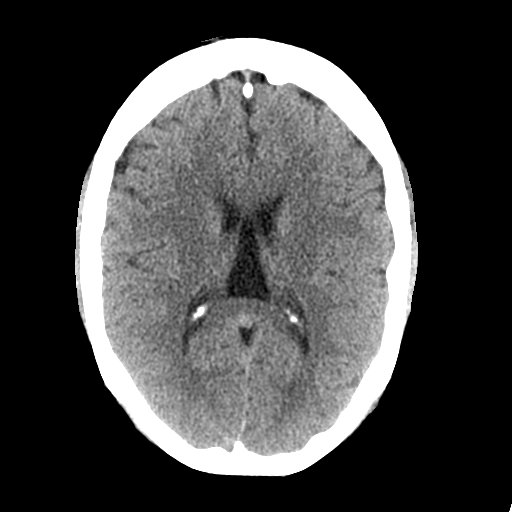
[im 32/32  brain]
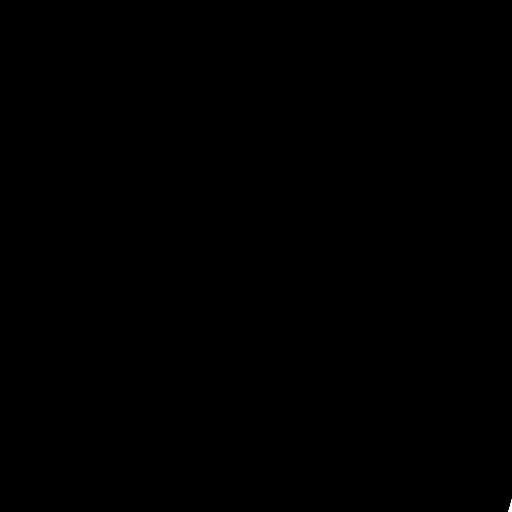

[Series 4: head bone · axial · 0.42mm/px · z∈[+1712,+1756]mm · 3 of 80 slices shown]
[im 12/80  bone]
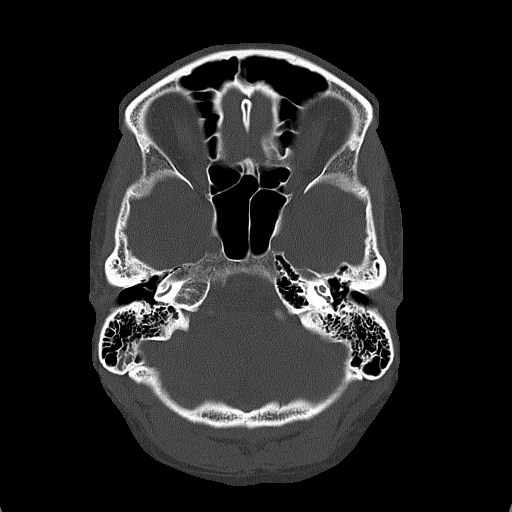
[im 23/80  bone]
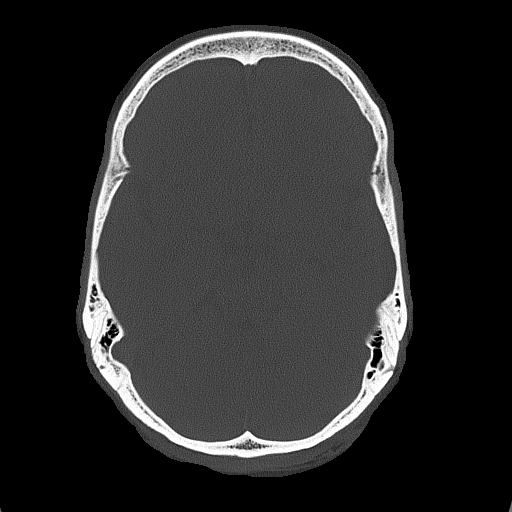
[im 34/80  bone]
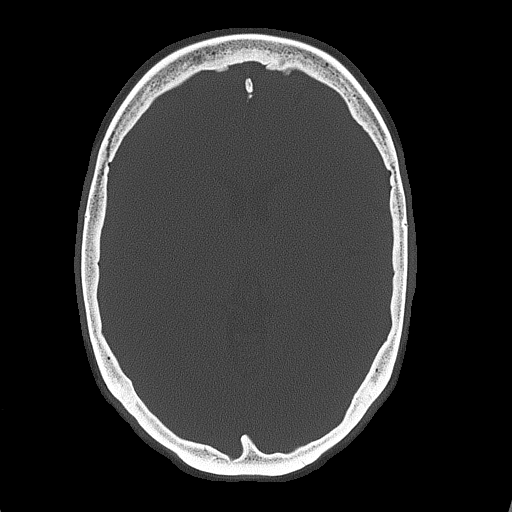

[Series 5: coronal soft tissue · coronal · 0.31mm/px · 3 of 67 slices shown]
[im 19/67  brain]
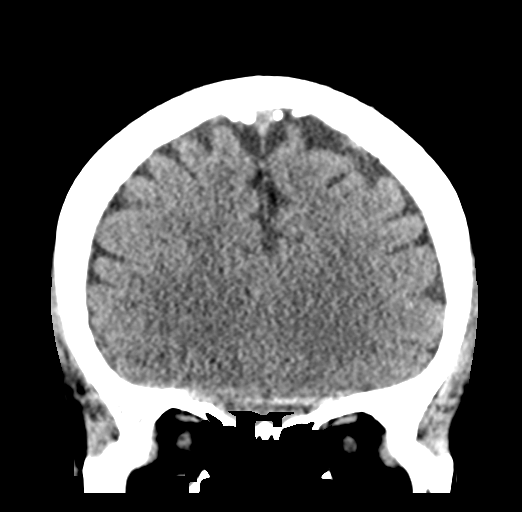
[im 29/67  brain]
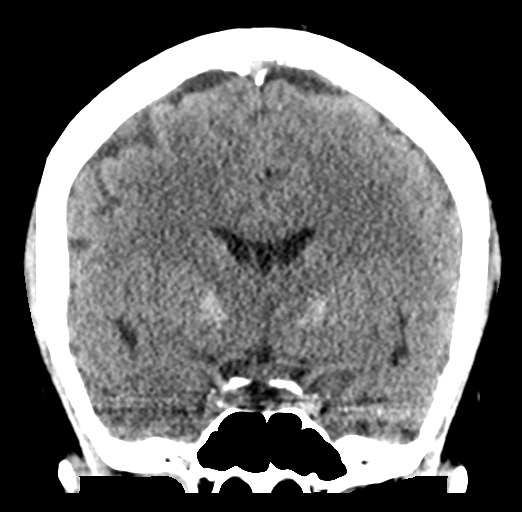
[im 38/67  brain]
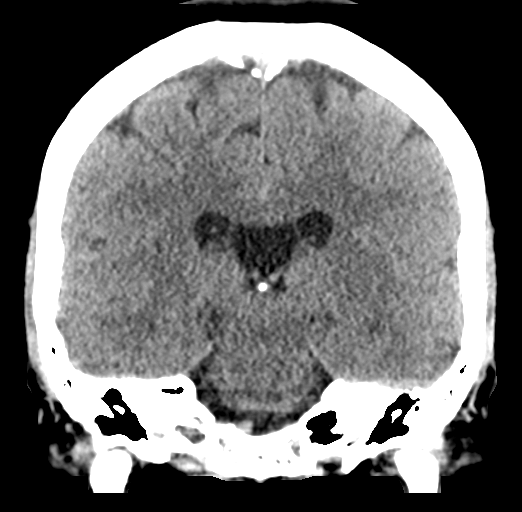

[Series 6: sagittal soft tissue · sagittal · 0.31mm/px · 1 of 52 slices shown]
[im 26/52  brain]
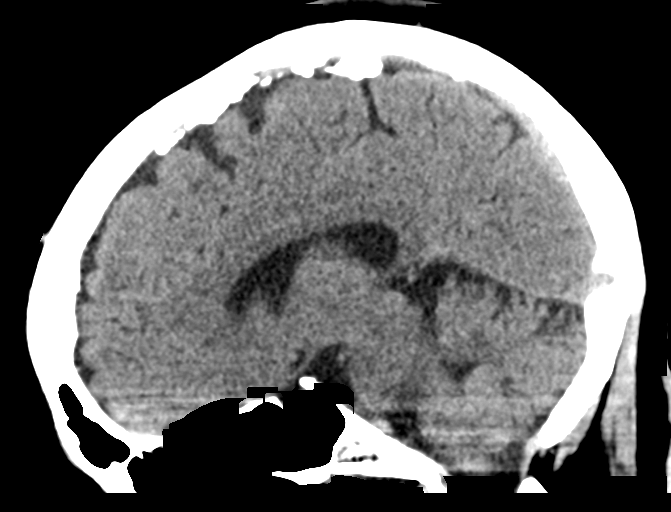

[Series 11: orthogonal bone · axial · 0.23mm/px · z∈[+1550,+1682]mm · 8 of 92 slices shown]
[im 11/92  bone]
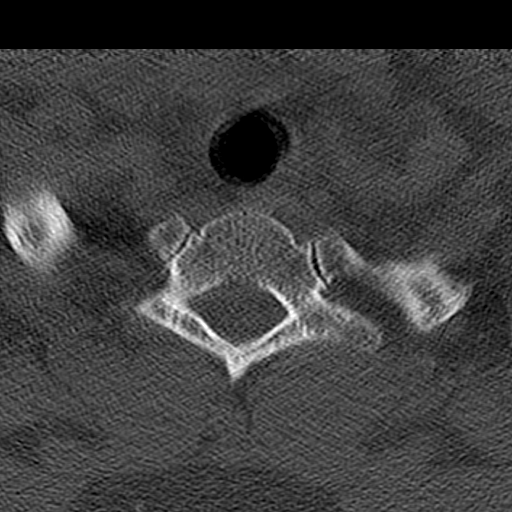
[im 21/92  bone]
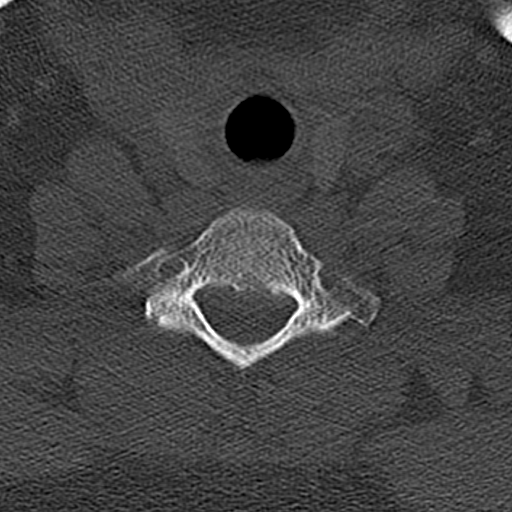
[im 31/92  bone]
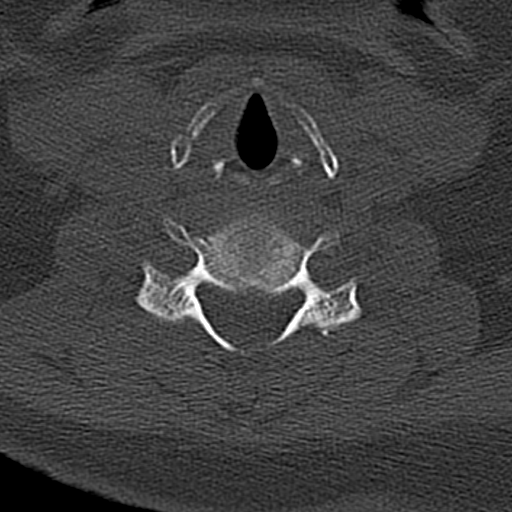
[im 41/92  bone]
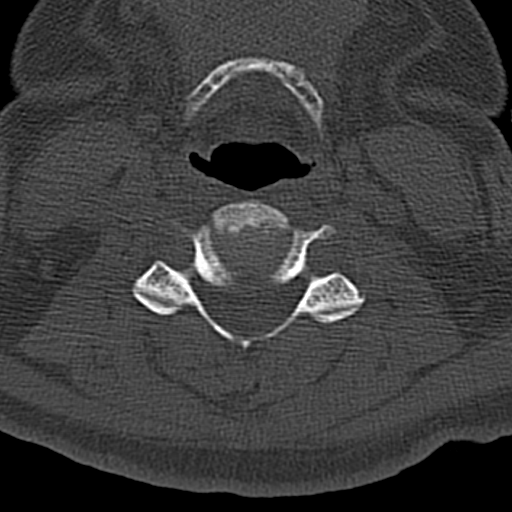
[im 51/92  bone]
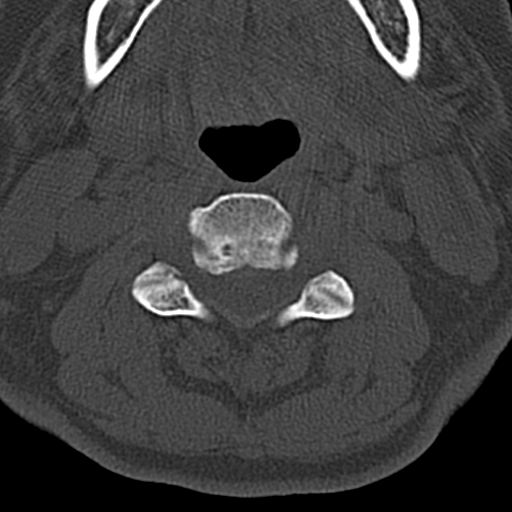
[im 61/92  bone]
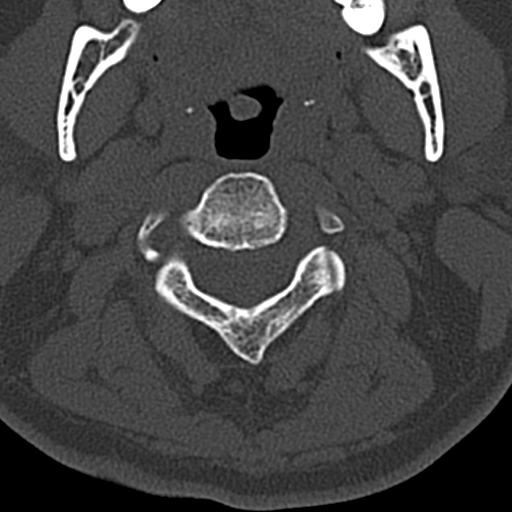
[im 71/92  bone]
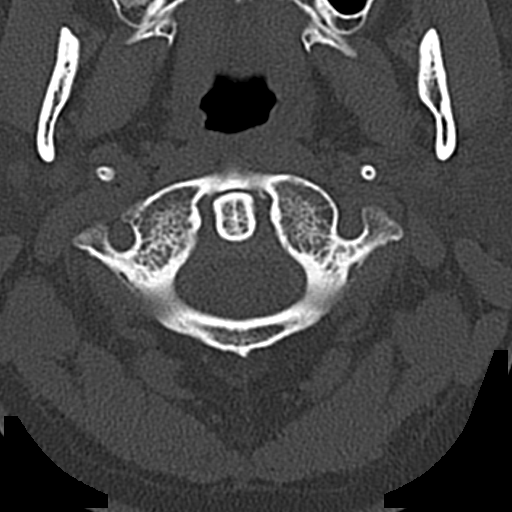
[im 81/92  bone]
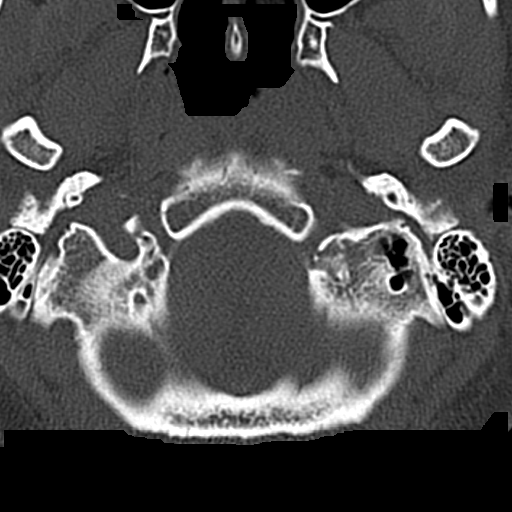

[18 of 47 positions shown; findings below may reference images not displayed]

FINDINGS: CT HEAD FINDINGS

Brain: There is no evidence of acute cortical infarct, intracranial
hemorrhage, mass, midline shift, or extra-axial fluid collection.
The ventricles and sulci are normal in size. An incidental cavum
septum pellucidum et vergae is noted, a normal variant.

Vascular: No hyperdense vessel or unexpected calcification.

Skull: No fracture or focal osseous lesion.

Sinuses/Orbits: Visualized paranasal sinuses and mastoid air cells
are clear. Visualized orbits are unremarkable.

Other: None.

CT CERVICAL SPINE FINDINGS

Alignment: No subluxation.

Skull base and vertebrae: No evidence of fracture or destructive
osseous process.

Soft tissues and spinal canal: No prevertebral fluid or swelling. No
visible canal hematoma.

Disc levels: Asymmetric right uncovertebral spurring at C3-4 results
in minimal right neural foraminal narrowing. Minimal right
uncovertebral spurring at C4-5 without stenosis.

Upper chest: Unremarkable.

Other: None.
IMPRESSION: 1. Negative head CT.
2. No evidence of acute osseous abnormality in the cervical spine.
3. Very mild cervical spondylosis.

## 2018-02-18 ENCOUNTER — Ambulatory Visit: Payer: Self-pay | Admitting: Family Medicine

## 2018-02-18 NOTE — Telephone Encounter (Signed)
Patient reports she has blurred vision, watery eyes, patient thinks that R eye is swelling- eyelid. Patient reports symptoms for 1 month. Patient is requesting an appointment. Call to office- appointment scheduled.  Reason for Disposition . [1] Blurred vision or visual changes AND [2] gradual onset (e.g., weeks, months)  Answer Assessment - Initial Assessment Questions 1. DESCRIPTION: "What is the vision loss like? Describe it for me." (e.g., complete vision loss, blurred vision, double vision, floaters, etc.)     Blurry vision- in the morning- R eye is worse than left 2. LOCATION: "One or both eyes?" If one, ask: "Which eye?"     Both eyes are blurry 3. SEVERITY: "Can you see anything?" If so, ask: "What can you see?" (e.g., fine print)     Patient can read and see - no problems reading small print in the bible 4. ONSET: "When did this begin?" "Did it start suddenly or has this been gradual?"     1 month ago 5. PATTERN: "Does this come and go, or has it been constant since it started?"     Mostly very early in the morning- blinking all day-foggy vision  6. PAIN: "Is there any pain in your eye(s)?"  (Scale 1-10; or mild, moderate, severe)     Tiny pain- feels like something is scratching eye 7. CONTACTS-GLASSES: "Do you wear contacts or glasses?"     No-long time along 8. CAUSE: "What do you think is causing this visual problem?"     Unknown 9. OTHER SYMPTOMS: "Do you have any other symptoms?" (e.g., confusion, headache, arm or leg weakness, speech problems)     no 10. PREGNANCY: "Is there any chance you are pregnant?" "When was your last menstrual period?"       No- LMP- 3 days ago  Protocols used: VISION LOSS OR CHANGE-A-AH

## 2018-02-21 ENCOUNTER — Ambulatory Visit: Payer: Self-pay | Admitting: Emergency Medicine

## 2018-02-21 ENCOUNTER — Other Ambulatory Visit: Payer: Self-pay

## 2018-02-21 ENCOUNTER — Encounter: Payer: Self-pay | Admitting: Emergency Medicine

## 2018-02-21 VITALS — BP 110/71 | HR 72 | Temp 98.9°F | Resp 16 | Ht 66.75 in | Wt 197.4 lb

## 2018-02-21 DIAGNOSIS — H02846 Edema of left eye, unspecified eyelid: Secondary | ICD-10-CM | POA: Insufficient documentation

## 2018-02-21 DIAGNOSIS — H0289 Other specified disorders of eyelid: Secondary | ICD-10-CM

## 2018-02-21 DIAGNOSIS — H5711 Ocular pain, right eye: Secondary | ICD-10-CM | POA: Insufficient documentation

## 2018-02-21 MED ORDER — PREDNISONE 20 MG PO TABS: 40.0000 mg | ORAL_TABLET | Freq: Every day | ORAL | 0 refills | Status: AC

## 2018-02-21 NOTE — Progress Notes (Signed)
[name] 42 y.o.   Chief Complaint  Patient presents with  . Eye Problem    x 1 month right swelling     HISTORY OF PRESENT ILLNESS: This is a 42 y.o. female complaining of intermittent swelling of her right eye for 1 month.  Denies injury.  Denies visual difficulty.  No other significant symptoms.  HPI   Prior to Admission medications   Medication Sig Start Date End Date Taking? Authorizing Provider  hydrOXYzine (ATARAX/VISTARIL) 25 MG tablet Take 1-2 tablets (25-50 mg total) by mouth every 6 (six) hours as needed for itching. Patient not taking: Reported on 04/27/2017 04/20/17   Stacey Flood, MD  meclizine (ANTIVERT) 32 MG tablet Take 32 mg by mouth 3 (three) times daily as needed.    [provider]  predniSONE (DELTASONE) 20 MG tablet 3 by mouth for 3 days, then 2 by mouth for 2 days, then 1 by mouth for 2 days, then 1/2 by mouth for 2 days. Patient not taking: Reported on 04/27/2017 04/20/17   Stacey Flood, MD    Allergies  Allergen Reactions  . Penicillins Other (See Comments)    childhood    Patient Active Problem List   Diagnosis Date Noted  . Acute allergic reaction 04/13/2017  . Rash and nonspecific skin eruption 04/13/2017  . BPPV (benign paroxysmal positional vertigo), right 11/25/2016  . Adjustment disorder with mixed anxiety and depressed mood 07/20/2016  . Chronic fatigue 07/20/2016    Past Medical History:  Diagnosis Date  . Anxiety   . BPPV (benign paroxysmal positional vertigo)   . Depression   . Hemorrhoid   . No pertinent past medical history   . Postpartum care and examination of lactating mother 11/20/2010  . Postpartum care following vaginal delivery (4/22) 08/31/2014    Past Surgical History:  Procedure Laterality Date  . CERVICAL CERCLAGE    . CERVICAL CERCLAGE N/A 02/21/2014   Procedure: Modified McDonald CERVICAL CERCLAGE ;  Surgeon: Stacey Del, MD;  Location: [facility];  Service: Gynecology;   Laterality: N/A;  EDD: 09/05/14  . EVALUATION UNDER ANESTHESIA WITH HEMORRHOIDECTOMY N/A 01/28/2017   Procedure: EXAM UNDER ANESTHESIA WITH HEMORRHOIDECTOMY;  Surgeon: Stacey Miyamoto, MD;  Location: [facility];  Service: General;  Laterality: N/A;  . HEMORRHOID SURGERY     after delivery  . LAPAROSCOPIC TUBAL LIGATION Bilateral 09/11/2014   Procedure: LAPAROSCOPIC TUBAL LIGATION With Cautery;  Surgeon: Stacey Del, MD;  Location: [facility];  Service: Gynecology;  Laterality: Bilateral;  . TUBAL LIGATION      Social History   Socioeconomic History  . Marital status: Married    Spouse name: Not on file  . Number of children: Not on file  . Years of education: Not on file  . Highest education level: Not on file  Occupational History  . Not on file  Social Needs  . Financial resource strain: Not on file  . Food insecurity:    Worry: Not on file    Inability: Not on file  . Transportation needs:    Medical: Not on file    Non-medical: Not on file  Tobacco Use  . Smoking status: Never Smoker  . Smokeless tobacco: Never Used  Substance and Sexual Activity  . Alcohol use: Yes    Comment: social  . Drug use: No  . Sexual activity: Yes    Birth control/protection: Surgical    Comment: BTL  Lifestyle  . Physical activity:    Days  per week: Not on file    Minutes per session: Not on file  . Stress: Not on file  Relationships  . Social connections:    Talks on phone: Not on file    Gets together: Not on file    Attends religious service: Not on file    Active member of club or organization: Not on file    Attends meetings of clubs or organizations: Not on file    Relationship status: Not on file  . Intimate partner violence:    Fear of current or ex partner: Not on file    Emotionally abused: Not on file    Physically abused: Not on file    Forced sexual activity: Not on file  Other Topics Concern  . Not on file  Social History Narrative  . Not on file      Family History  Problem Relation Age of Onset  . Heart disease Mother   . Hyperlipidemia Mother      Review of Systems  Constitutional: Negative.  Negative for chills and fever.  HENT: Negative for sore throat.   Eyes: Positive for pain. Negative for blurred vision and double vision.  Respiratory: Negative for cough and shortness of breath.   Cardiovascular: Negative for chest pain and palpitations.  Gastrointestinal: Negative for abdominal pain, nausea and vomiting.  Skin: Negative.  Negative for rash.  Neurological: Negative for dizziness and headaches.  Endo/Heme/Allergies: Negative.    Vitals:   02/21/18 0806  BP: 110/71  Pulse: 72  Resp: 16  Temp: 98.9 F (37.2 C)  SpO2: 99%     Physical Exam  Constitutional: She is oriented to person, place, and time. She appears well-developed and well-nourished.  HENT:  Head: Normocephalic and atraumatic.  Mouth/Throat: Oropharynx is clear and moist.  Eyes: Pupils are equal, round, and reactive to light. Conjunctivae and EOM are normal.  Mild swelling of right upper eyelid  Neck: Normal range of motion. Neck supple.  Cardiovascular: Normal rate and regular rhythm.  Pulmonary/Chest: Effort normal and breath sounds normal.  Musculoskeletal: Normal range of motion.  Neurological: She is alert and oriented to person, place, and time. No cranial nerve deficit or sensory deficit. She exhibits normal muscle tone.  Skin: Skin is warm. Capillary refill takes less than 2 seconds.  Psychiatric: She has a normal mood and affect. Her behavior is normal.  Vitals reviewed.    ASSESSMENT & PLAN: [name] was seen today for eye problem.  Diagnoses and all orders for this visit:  Pain and swelling of eyelid of left eye  Eye discomfort, right -     Ambulatory referral to Ophthalmology    Patient Instructions       If you have lab work done today you will be contacted with your lab results within the next 2 weeks.  If you have  not heard from [place] then please contact us. The fastest way to get your results is to register for My Chart.   IF you received an x-ray today, you will receive an invoice from [facility]. Please contact [facility] at [phone] with questions or concerns regarding your invoice.   IF you received labwork today, you will receive an invoice from [facility]. Please contact [facility] at [phone] with questions or concerns regarding your invoice.   Our billing staff will not be able to assist you with questions regarding bills from these companies.  You will be contacted with the lab results as soon as they are available.  The fastest way to get your results is to activate your My Chart account. Instructions are located on the last page of this paperwork. If you have not heard from [place] regarding the results in 2 weeks, please contact this office.     Blefaritis (Blepharitis) El trmino blefaritis significa inflamacin de los prpados. CUIDADOS EN EL HOGAR Est atento a cualquier cambio en su aspecto o en cmo se siente. Estas indicaciones pueden ayudarlo con el trastorno: Mantener la limpieza  Lvese las manos con frecuencia.  Lvese los prpados con agua tibia, que puede o no estar mezclada con una pequea cantidad de champ para beb. Hgalo 2o msveces al [employer].  Lvese el rostro y las cejas por lo menos una vez al da.  Use una toalla limpia cada vez que se seque los prpados. No use la toalla para limpiar o secar otras zonas Wu cuerpo. No comparta la toalla con nadie. Instrucciones generales  No use maquillaje hasta que est mejor. No comparta el maquillaje con nadie.  No se frote los ojos.  Pngase una compresa tibia en los ojos 2veces al da durante cada vez, o como se lo haya indicado el mdico.  Si le indicaron que se ponga una crema medicinal o gotas oftlmicas, hgalo como se lo haya indicado el mdico. No deje de usar los medicamentos aunque  comience a Actor.  Concurra a todas las visitas de control como se lo haya indicado el mdico. Esto es importante. SOLICITE AYUDA SI:  Los prpados estn calientes al tacto.  Tiene ampollas en los prpados.  Tiene una erupcin cutnea en los prpados.  La inflamacin no desaparece en el trmino de 2 a 4das.  La inflamacin empeora. SOLICITE AYUDA DE INMEDIATO SI:  Siente un dolor que empeora.  Siente un dolor que se extiende a Corporate treasurer Wu rostro.  Tiene enrojecimiento, y este sntoma [address].  Tiene enrojecimiento que se extiende a Corporate treasurer Wu rostro.  Hay cambios en la visin.  Siente dolor al [employer] luces o los objetos que se [place].  Tiene fiebre. Esta informacin no tiene Theme park manager el consejo Wu mdico. Asegrese de hacerle al mdico cualquier pregunta que tenga. Document Released: 07/24/2008 Document Revised: 01/16/2015 Document Reviewed: 08/20/2014 Elsevier Interactive Patient Education  2018 Elsevier Inc.  Blepharitis Blepharitis means swollen eyelids. Follow these instructions at home: Pay attention to any changes in how you look or feel. Follow these instructions to help with your condition: Keeping Clean  Wash your hands often.  Wash your eyelids with warm water, or wash them with warm water that is mixed with little bit of baby shampoo. Do this 2 or more times per day.  Wash your face and eyebrows at least once a day.  Use a clean towel each time you dry your eyelids. Do not use the towel to clean or dry other areas of your body. Do not share your towel with anyone. General instructions  Avoid wearing makeup until you get better. Do not share makeup with anyone.  Avoid rubbing your eyes.  Put a warm compress on your eyes 2 times per day for 10 minutes at a time or as told by your doctor.  If you were told to use an medicated cream or eye drops, use the medicine as told by your doctor. Do not stop using the medicine  even if you feel better.  Keep all follow-up visits as told by your doctor. This is important. Contact a doctor if:  Your eyelids feel hot.  You have blisters on your eyelids.  You have a rash on your eyelids.  The swelling does not go away in 2-4 days.  The swelling gets worse. Get help right away if:  You have pain that gets worse.  You have pain that spreads to other parts of your face.  You have redness that gets worse.  You have redness that spreads to other parts of your face.  Your vision changes.  You have pain when you look at lights or things that move.  You have a fever. This information is not intended to replace advice given to you by your health care provider. Make sure you discuss any questions you have with your health care provider. Document Released: 02/04/2008 Document Revised: 10/03/2015 Document Reviewed: 08/20/2014 Elsevier Interactive Patient Education  2018 [employer].      Edwina Barth, MD [facility]

## 2018-02-21 NOTE — Patient Instructions (Addendum)
If you have lab work done today you will be contacted with your lab results within the next 2 weeks.  If you have not heard from [place] then please contact us. The fastest way to get your results is to register for My Chart.   IF you received an x-ray today, you will receive an invoice from [facility]. Please contact [facility] at [phone] with questions or concerns regarding your invoice.   IF you received labwork today, you will receive an invoice from [facility]. Please contact [facility] at [phone] with questions or concerns regarding your invoice.   Our billing staff will not be able to assist you with questions regarding bills from these companies.  You will be contacted with the lab results as soon as they are available. The fastest way to get your results is to activate your My Chart account. Instructions are located on the last page of this paperwork. If you have not heard from [place] regarding the results in 2 weeks, please contact this office.     Blefaritis (Blepharitis) El trmino blefaritis significa inflamacin de los prpados. CUIDADOS EN EL HOGAR Est atento a cualquier cambio en su aspecto o en cmo se siente. Estas indicaciones pueden ayudarlo con el trastorno: Mantener la limpieza  Lvese las manos con frecuencia.  Lvese los prpados con agua tibia, que puede o no estar mezclada con una pequea cantidad de champ para beb. Hgalo 2o msveces al [employer].  Lvese el rostro y las cejas por lo menos una vez al da.  Use una toalla limpia cada vez que se seque los prpados. No use la toalla para limpiar o secar otras zonas del cuerpo. No comparta la toalla con nadie. Instrucciones generales  No use maquillaje hasta que est mejor. No comparta el maquillaje con nadie.  No se frote los ojos.  Pngase una compresa tibia en los ojos 2veces al da durante cada vez, o como se lo haya indicado el mdico.  Si le indicaron que se ponga una  crema medicinal o gotas oftlmicas, hgalo como se lo haya indicado el mdico. No deje de usar los medicamentos aunque comience a Actor.  Concurra a todas las visitas de control como se lo haya indicado el mdico. Esto es importante. SOLICITE AYUDA SI:  Los prpados estn calientes al tacto.  Tiene ampollas en los prpados.  Tiene una erupcin cutnea en los prpados.  La inflamacin no desaparece en el trmino de 2 a 4das.  La inflamacin empeora. SOLICITE AYUDA DE INMEDIATO SI:  Siente un dolor que empeora.  Siente un dolor que se extiende a Corporate treasurer del rostro.  Tiene enrojecimiento, y este sntoma [address].  Tiene enrojecimiento que se extiende a Corporate treasurer del rostro.  Hay cambios en la visin.  Siente dolor al [employer] luces o los objetos que se [place].  Tiene fiebre. Esta informacin no tiene Theme park manager el consejo del mdico. Asegrese de hacerle al mdico cualquier pregunta que tenga. Document Released: 07/24/2008 Document Revised: 01/16/2015 Document Reviewed: 08/20/2014 Elsevier Interactive Patient Education  2018 Elsevier Inc.  Blepharitis Blepharitis means swollen eyelids. Follow these instructions at home: Pay attention to any changes in how you look or feel. Follow these instructions to help with your condition: Keeping Clean  Wash your hands often.  Wash your eyelids with warm water, or wash them with warm water that is mixed with little bit of baby shampoo. Do this 2 or more times per day.  Wash  your face and eyebrows at least once a day.  Use a clean towel each time you dry your eyelids. Do not use the towel to clean or dry other areas of your body. Do not share your towel with anyone. General instructions  Avoid wearing makeup until you get better. Do not share makeup with anyone.  Avoid rubbing your eyes.  Put a warm compress on your eyes 2 times per day for 10 minutes at a time or as told by your doctor.  If you were  told to use an medicated cream or eye drops, use the medicine as told by your doctor. Do not stop using the medicine even if you feel better.  Keep all follow-up visits as told by your doctor. This is important. Contact a doctor if:  Your eyelids feel hot.  You have blisters on your eyelids.  You have a rash on your eyelids.  The swelling does not go away in 2-4 days.  The swelling gets worse. Get help right away if:  You have pain that gets worse.  You have pain that spreads to other parts of your face.  You have redness that gets worse.  You have redness that spreads to other parts of your face.  Your vision changes.  You have pain when you look at lights or things that move.  You have a fever. This information is not intended to replace advice given to you by your health care provider. Make sure you discuss any questions you have with your health care provider. Document Released: 02/04/2008 Document Revised: 10/03/2015 Document Reviewed: 08/20/2014 Elsevier Interactive Patient Education  [employer].

## 2018-12-29 ENCOUNTER — Other Ambulatory Visit: Payer: Self-pay

## 2018-12-29 DIAGNOSIS — Z20822 Contact with and (suspected) exposure to covid-19: Secondary | ICD-10-CM

## 2018-12-31 LAB — NOVEL CORONAVIRUS, NAA: SARS-CoV-2, NAA: NOT DETECTED

## 2019-10-20 ENCOUNTER — Ambulatory Visit (INDEPENDENT_AMBULATORY_CARE_PROVIDER_SITE_OTHER): Payer: Self-pay | Admitting: Family Medicine

## 2019-10-20 ENCOUNTER — Other Ambulatory Visit: Payer: Self-pay

## 2019-10-20 ENCOUNTER — Encounter: Payer: Self-pay | Admitting: Family Medicine

## 2019-10-20 VITALS — BP 132/86 | HR 71 | Temp 98.5°F | Ht 68.0 in | Wt 188.8 lb

## 2019-10-20 DIAGNOSIS — F439 Reaction to severe stress, unspecified: Secondary | ICD-10-CM

## 2019-10-20 DIAGNOSIS — R42 Dizziness and giddiness: Secondary | ICD-10-CM

## 2019-10-20 DIAGNOSIS — G43001 Migraine without aura, not intractable, with status migrainosus: Secondary | ICD-10-CM

## 2019-10-20 DIAGNOSIS — Z1329 Encounter for screening for other suspected endocrine disorder: Secondary | ICD-10-CM

## 2019-10-20 DIAGNOSIS — R110 Nausea: Secondary | ICD-10-CM

## 2019-10-20 MED ORDER — MECLIZINE HCL 25 MG PO TABS: 25.0000 mg | ORAL_TABLET | Freq: Three times a day (TID) | ORAL | 0 refills | Status: DC | PRN

## 2019-10-20 MED ORDER — BUTALBITAL-APAP-CAFFEINE 50-325-40 MG PO TABS: 1.0000 | ORAL_TABLET | Freq: Four times a day (QID) | ORAL | 0 refills | Status: AC | PRN

## 2019-10-20 MED ORDER — ONDANSETRON HCL 4 MG PO TABS: 4.0000 mg | ORAL_TABLET | Freq: Three times a day (TID) | ORAL | 0 refills | Status: DC | PRN

## 2019-10-20 MED ORDER — RIZATRIPTAN BENZOATE 5 MG PO TABS: 5.0000 mg | ORAL_TABLET | ORAL | 0 refills | Status: DC | PRN

## 2019-10-20 NOTE — Patient Instructions (Addendum)
Take Tylenol (acetaminophen) 500 mg 2 pills 3 times daily if needed for headache or neck pain  Take Fioricet (butalbital) 1 or 2 every 6 hours if needed for severe headache  At the onset of a migraine headache try taking a Maxalt (rizatriptan) 5 mg.  If it does not break the migraine you can repeat with 1-2 pills after 2 hours.  Then you can repeat daily if needed.  Ondansetron 4 mg can be taken every 6 or 8 hours as needed for nausea  Meclizine 25 mg 1/2 to 1 pill can be taken every 6 or 8 hours as needed for dizziness.  Migraines are a common problem.  You can try and look online to read up on them.  If you keep having too many problems with them we can refer you to a headache pain specialist.  Return as needed   Migraine Headache A migraine headache is an intense, throbbing pain on one side or both sides of the head. Migraine headaches may also cause other symptoms, such as nausea, vomiting, and sensitivity to light and noise. A migraine headache can last from 4 hours to 3 days. Talk with your doctor about what things may bring on (trigger) your migraine headaches. What are the causes? The exact cause of this condition is not known. However, a migraine may be caused when nerves in the brain become irritated and release chemicals that cause inflammation of blood vessels. This inflammation causes pain. This condition may be triggered or caused by:  Drinking alcohol.  Smoking.  Taking medicines, such as: ? Medicine used to treat chest pain (nitroglycerin). ? Birth control pills. ? Estrogen. ? Certain blood pressure medicines.  Eating or drinking products that contain nitrates, glutamate, aspartame, or tyramine. Aged cheeses, chocolate, or caffeine may also be triggers.  Doing physical activity. Other things that may trigger a migraine headache include:  Menstruation.  Pregnancy.  Hunger.  Stress.  Lack of sleep or too much sleep.  Weather changes.  Fatigue. What  increases the risk? The following factors may make you more likely to experience migraine headaches:  Being a certain age. This condition is more common in people who are 82-51 years old.  Being female.  Having a family history of migraine headaches.  Being Caucasian.  Having a mental health condition, such as depression or anxiety.  Being obese. What are the signs or symptoms? The main symptom of this condition is pulsating or throbbing pain. This pain may:  Happen in any area of the head, such as on one side or both sides.  Interfere with daily activities.  Get worse with physical activity.  Get worse with exposure to bright lights or loud noises. Other symptoms may include:  Nausea.  Vomiting.  Dizziness.  General sensitivity to bright lights, loud noises, or smells. Before you get a migraine headache, you may get warning signs (an aura). An aura may include:  Seeing flashing lights or having blind spots.  Seeing bright spots, halos, or zigzag lines.  Having tunnel vision or blurred vision.  Having numbness or a tingling feeling.  Having trouble talking.  Having muscle weakness. Some people have symptoms after a migraine headache (postdromal phase), such as:  Feeling tired.  Difficulty concentrating. How is this diagnosed? A migraine headache can be diagnosed based on:  Your symptoms.  A physical exam.  Tests, such as: ? CT scan or an MRI of the head. These imaging tests can help rule out other causes of headaches. ?  Taking fluid from the spine (lumbar puncture) and analyzing it (cerebrospinal fluid analysis, or CSF analysis). How is this treated? This condition may be treated with medicines that:  Relieve pain.  Relieve nausea.  Prevent migraine headaches. Treatment for this condition may also include:  Acupuncture.  Lifestyle changes like avoiding foods that trigger migraine headaches.  Biofeedback.  Cognitive behavioral  therapy. Follow these instructions at home: Medicines  Take over-the-counter and prescription medicines only as told by your health care provider.  Ask your health care provider if the medicine prescribed to you: ? Requires you to avoid driving or using heavy machinery. ? Can cause constipation. You may need to take these actions to prevent or treat constipation:  Drink enough fluid to keep your urine pale yellow.  Take over-the-counter or prescription medicines.  Eat foods that are high in fiber, such as beans, whole grains, and fresh fruits and vegetables.  Limit foods that are high in fat and processed sugars, such as fried or sweet foods. Lifestyle  Do not drink alcohol.  Do not use any products that contain nicotine or tobacco, such as cigarettes, e-cigarettes, and chewing tobacco. If you need help quitting, ask your health care provider.  Get at least 8 hours of sleep every night.  Find ways to manage stress, such as meditation, deep breathing, or yoga. General instructions      Keep a journal to find out what may trigger your migraine headaches. For example, write down: ? What you eat and drink. ? How much sleep you get. ? Any change to your diet or medicines.  If you have a migraine headache: ? Avoid things that make your symptoms worse, such as bright lights. ? It may help to lie down in a dark, quiet room. ? Do not drive or use heavy machinery. ? Ask your health care provider what activities are safe for you while you are experiencing symptoms.  Keep all follow-up visits as told by your health care provider. This is important. Contact a health care provider if:  You develop symptoms that are different or more severe than your usual migraine headache symptoms.  You have more than 15 headache days in one month. Get help right away if:  Your migraine headache becomes severe.  Your migraine headache lasts longer than 72 hours.  You have a fever.  You have  a stiff neck.  You have vision loss.  Your muscles feel weak or like you cannot control them.  You start to lose your balance often.  You have trouble walking.  You faint.  You have a seizure. Summary  A migraine headache is an intense, throbbing pain on one side or both sides of the head. Migraines may also cause other symptoms, such as nausea, vomiting, and sensitivity to light and noise.  This condition may be treated with medicines and lifestyle changes. You may also need to avoid certain things that trigger a migraine headache.  Keep a journal to find out what may trigger your migraine headaches.  Contact your health care provider if you have more than 15 headache days in a month or you develop symptoms that are different or more severe than your usual migraine headache symptoms. This information is not intended to replace advice given to you by your health care provider. Make sure you discuss any questions you have with your health care provider. Document Revised: 08/19/2018 Document Reviewed: 06/09/2018 Elsevier Patient Education  [employer].    If you have lab  work done today you will be contacted with your lab results within the next 2 weeks.  If you have not heard from [place] then please contact us. The fastest way to get your results is to register for My Chart.   IF you received an x-ray today, you will receive an invoice from [facility]. Please contact [facility] at [phone] with questions or concerns regarding your invoice.   IF you received labwork today, you will receive an invoice from [facility]. Please contact [facility] at [phone] with questions or concerns regarding your invoice.   Our billing staff will not be able to assist you with questions regarding bills from these companies.  You will be contacted with the lab results as soon as they are available. The fastest way to get your results is to activate your My Chart account.  Instructions are located on the last page of this paperwork. If you have not heard from [place] regarding the results in 2 weeks, please contact this office.

## 2019-10-20 NOTE — Progress Notes (Signed)
Patient ID: [name], female    DOB: 1975-10-24  Age: 44 y.o. MRN: [id number]  Chief Complaint  Patient presents with  . Follow-up    Pt stated that she has been having sharp pain in her Rt eye and the pain runs to the back of her head.along with some dizzness.    Subjective:   44 year old lady who was sent over here from Dr. Randon Goldsmith office by Dr. Allena Katz.  She had a bad headache behind her right eye and right occipital area like something was stabbing her through her head.  This been going on for 3 days.  She has had nausea and some dizziness.  She has had milder episodes of this in the past.  When she had last time she was sent from this office over to the eye doctor so she went to the eye doctor's office first this time and was sent back over here.  She did not know of having a history of migraines, though she has had some milder episodes of this in the past.  She has been under more stress lately with tension in her neck and shoulders.  No family history of migraine.  She has had her tubes tied and not eligible to get pregnant.  She has taken one Tylenol a couple of times for the headache.  Current allergies, medications, problem list, past/family and social histories reviewed.  Objective:  BP 132/86 (BP Location: Right Arm, Patient Position: Sitting, Cuff Size: Normal)   Pulse 71   Temp 98.5 F (36.9 C) (Temporal)   Ht 5\' 8"  (1.727 m)   Wt 188 lb 12.8 oz (85.6 kg)   SpO2 99%   BMI 28.71 kg/m   Pleasant, alert, oriented.  No major distress.  Eyes pupils are both dilated from having seen the eye doctor.  Her TMs are normal.  Neck supple without nodes or thyromegaly.  No carotid bruits.  No scalp tenderness.  Chest clear to auscultation.  Heart regular without murmur.  Cranial nerves grossly intact.  Finger-nose normal. sensory grossly intact.  Gait normal.  Assessment & Plan:   Assessment: 1. Migraine without aura and with status migrainosus, not intractable   2. Nausea     3. Dizziness       Plan: Classic migraine.  See instructions.  No orders of the defined types were placed in this encounter.   Meds ordered this encounter  Medications  . ondansetron (ZOFRAN) 4 MG tablet    Sig: Take 1 tablet (4 mg total) by mouth every 8 (eight) hours as needed for nausea or vomiting.    Dispense:  20 tablet    Refill:  0  . meclizine (ANTIVERT) 25 MG tablet    Sig: Take 1 tablet (25 mg total) by mouth 3 (three) times daily as needed for dizziness.    Dispense:  30 tablet    Refill:  0  . rizatriptan (MAXALT) 5 MG tablet    Sig: Take 1 tablet (5 mg total) by mouth as needed for migraine. May repeat in 2 hours if needed    Dispense:  6 tablet    Refill:  0  . butalbital-acetaminophen-caffeine (FIORICET) 50-325-40 MG tablet    Sig: Take 1-2 tablets by mouth every 6 (six) hours as needed for headache.    Dispense:  20 tablet    Refill:  0         Patient Instructions    Take Tylenol (acetaminophen) 500 mg 2 pills 3 times  daily if needed for headache or neck pain  Take Fioricet (butalbital) 1 or 2 every 6 hours if needed for severe headache  At the onset of a migraine headache try taking a Maxalt (rizatriptan) 5 mg.  If it does not break the migraine you can repeat with 1-2 pills after 2 hours.  Then you can repeat daily if needed.  Ondansetron 4 mg can be taken every 6 or 8 hours as needed for nausea  Meclizine 25 mg 1/2 to 1 pill can be taken every 6 or 8 hours as needed for dizziness.  Migraines are a common problem.  You can try and look online to read up on them.  If you keep having too many problems with them we can refer you to a headache pain specialist.  Return as needed   Migraine Headache A migraine headache is an intense, throbbing pain on one side or both sides of the head. Migraine headaches may also cause other symptoms, such as nausea, vomiting, and sensitivity to light and noise. A migraine headache can last from 4 hours to 3  days. Talk with your doctor about what things may bring on (trigger) your migraine headaches. What are the causes? The exact cause of this condition is not known. However, a migraine may be caused when nerves in the brain become irritated and release chemicals that cause inflammation of blood vessels. This inflammation causes pain. This condition may be triggered or caused by:  Drinking alcohol.  Smoking.  Taking medicines, such as: ? Medicine used to treat chest pain (nitroglycerin). ? Birth control pills. ? Estrogen. ? Certain blood pressure medicines.  Eating or drinking products that contain nitrates, glutamate, aspartame, or tyramine. Aged cheeses, chocolate, or caffeine may also be triggers.  Doing physical activity. Other things that may trigger a migraine headache include:  Menstruation.  Pregnancy.  Hunger.  Stress.  Lack of sleep or too much sleep.  Weather changes.  Fatigue. What increases the risk? The following factors may make you more likely to experience migraine headaches:  Being a certain age. This condition is more common in people who are 23-47 years old.  Being female.  Having a family history of migraine headaches.  Being Caucasian.  Having a mental health condition, such as depression or anxiety.  Being obese. What are the signs or symptoms? The main symptom of this condition is pulsating or throbbing pain. This pain may:  Happen in any area of the head, such as on one side or both sides.  Interfere with daily activities.  Get worse with physical activity.  Get worse with exposure to bright lights or loud noises. Other symptoms may include:  Nausea.  Vomiting.  Dizziness.  General sensitivity to bright lights, loud noises, or smells. Before you get a migraine headache, you may get warning signs (an aura). An aura may include:  Seeing flashing lights or having blind spots.  Seeing bright spots, halos, or zigzag  lines.  Having tunnel vision or blurred vision.  Having numbness or a tingling feeling.  Having trouble talking.  Having muscle weakness. Some people have symptoms after a migraine headache (postdromal phase), such as:  Feeling tired.  Difficulty concentrating. How is this diagnosed? A migraine headache can be diagnosed based on:  Your symptoms.  A physical exam.  Tests, such as: ? CT scan or an MRI of the head. These imaging tests can help rule out other causes of headaches. ? Taking fluid from the spine (lumbar puncture) and  analyzing it (cerebrospinal fluid analysis, or CSF analysis). How is this treated? This condition may be treated with medicines that:  Relieve pain.  Relieve nausea.  Prevent migraine headaches. Treatment for this condition may also include:  Acupuncture.  Lifestyle changes like avoiding foods that trigger migraine headaches.  Biofeedback.  Cognitive behavioral therapy. Follow these instructions at home: Medicines  Take over-the-counter and prescription medicines only as told by your health care provider.  Ask your health care provider if the medicine prescribed to you: ? Requires you to avoid driving or using heavy machinery. ? Can cause constipation. You may need to take these actions to prevent or treat constipation:  Drink enough fluid to keep your urine pale yellow.  Take over-the-counter or prescription medicines.  Eat foods that are high in fiber, such as beans, whole grains, and fresh fruits and vegetables.  Limit foods that are high in fat and processed sugars, such as fried or sweet foods. Lifestyle  Do not drink alcohol.  Do not use any products that contain nicotine or tobacco, such as cigarettes, e-cigarettes, and chewing tobacco. If you need help quitting, ask your health care provider.  Get at least 8 hours of sleep every night.  Find ways to manage stress, such as meditation, deep breathing, or yoga. General  instructions      Keep a journal to find out what may trigger your migraine headaches. For example, write down: ? What you eat and drink. ? How much sleep you get. ? Any change to your diet or medicines.  If you have a migraine headache: ? Avoid things that make your symptoms worse, such as bright lights. ? It may help to lie down in a dark, quiet room. ? Do not drive or use heavy machinery. ? Ask your health care provider what activities are safe for you while you are experiencing symptoms.  Keep all follow-up visits as told by your health care provider. This is important. Contact a health care provider if:  You develop symptoms that are different or more severe than your usual migraine headache symptoms.  You have more than 15 headache days in one month. Get help right away if:  Your migraine headache becomes severe.  Your migraine headache lasts longer than 72 hours.  You have a fever.  You have a stiff neck.  You have vision loss.  Your muscles feel weak or like you cannot control them.  You start to lose your balance often.  You have trouble walking.  You faint.  You have a seizure. Summary  A migraine headache is an intense, throbbing pain on one side or both sides of the head. Migraines may also cause other symptoms, such as nausea, vomiting, and sensitivity to light and noise.  This condition may be treated with medicines and lifestyle changes. You may also need to avoid certain things that trigger a migraine headache.  Keep a journal to find out what may trigger your migraine headaches.  Contact your health care provider if you have more than 15 headache days in a month or you develop symptoms that are different or more severe than your usual migraine headache symptoms. This information is not intended to replace advice given to you by your health care provider. Make sure you discuss any questions you have with your health care provider. Document Revised:  08/19/2018 Document Reviewed: 06/09/2018 Elsevier Patient Education  [employer].    If you have lab work done today you will be contacted with  your lab results within the next 2 weeks.  If you have not heard from [place] then please contact us. The fastest way to get your results is to register for My Chart.   IF you received an x-ray today, you will receive an invoice from [facility]. Please contact [facility] at [phone] with questions or concerns regarding your invoice.   IF you received labwork today, you will receive an invoice from [facility]. Please contact [facility] at [phone] with questions or concerns regarding your invoice.   Our billing staff will not be able to assist you with questions regarding bills from these companies.  You will be contacted with the lab results as soon as they are available. The fastest way to get your results is to activate your My Chart account. Instructions are located on the last page of this paperwork. If you have not heard from [place] regarding the results in 2 weeks, please contact this office.        Return if symptoms worsen or fail to improve.   Ruben Reason, MD 10/20/2019

## 2019-11-02 ENCOUNTER — Telehealth: Payer: Self-pay | Admitting: Emergency Medicine

## 2019-11-02 NOTE — Telephone Encounter (Signed)
Pt was seen by Dr. Alwyn Ren recently for headaches  / pt has an appt  11/08/2019  With Dr, Alvy Bimler  per Dr. Allena Katz  Of [facility] she is needing a thyroid panel .

## 2019-11-02 NOTE — Telephone Encounter (Signed)
Patient was last seen by DR Alwyn Ren for headaches on 10/20/2019. Have also been seen by DR Allena Katz the eye doctor and the eye doctor is requesting [place] to check a thyroid panel on this patient. Patient is schedule to be seen by you on 11/08/19. Would you like [place] to schedule this patient a lab visit appt before  her 11/08/19 appt? Or would you like to see this patient before we place this order.

## 2019-11-02 NOTE — Telephone Encounter (Signed)
Place the order please.  Thanks.

## 2019-11-03 ENCOUNTER — Other Ambulatory Visit: Payer: Self-pay | Admitting: Emergency Medicine

## 2019-11-03 NOTE — Addendum Note (Signed)
Addended by: Lorenda Hatchet R on: 11/03/2019 10:18 AM   Modules accepted: Orders

## 2019-11-03 NOTE — Telephone Encounter (Signed)
Thyroid panel has been ordered and patient will come in Monday 11/06/19 to have labs

## 2019-11-06 ENCOUNTER — Other Ambulatory Visit: Payer: Self-pay

## 2019-11-06 ENCOUNTER — Ambulatory Visit: Payer: Self-pay

## 2019-11-06 DIAGNOSIS — Z1329 Encounter for screening for other suspected endocrine disorder: Secondary | ICD-10-CM

## 2019-11-07 LAB — THYROID PANEL WITH TSH
Free Thyroxine Index: 1.6 (ref 1.2–4.9)
T3 Uptake Ratio: 23 % — ABNORMAL LOW (ref 24–39)
T4, Total: 7 ug/dL (ref 4.5–12.0)
TSH: 5.88 u[IU]/mL — ABNORMAL HIGH (ref 0.450–4.500)

## 2019-11-08 ENCOUNTER — Ambulatory Visit (INDEPENDENT_AMBULATORY_CARE_PROVIDER_SITE_OTHER): Payer: Self-pay | Admitting: Emergency Medicine

## 2019-11-08 ENCOUNTER — Other Ambulatory Visit: Payer: Self-pay

## 2019-11-08 ENCOUNTER — Encounter: Payer: Self-pay | Admitting: Emergency Medicine

## 2019-11-08 VITALS — BP 125/78 | HR 88 | Temp 98.4°F | Resp 14 | Ht 68.0 in | Wt 193.4 lb

## 2019-11-08 DIAGNOSIS — E039 Hypothyroidism, unspecified: Secondary | ICD-10-CM

## 2019-11-08 MED ORDER — LEVOTHYROXINE SODIUM 50 MCG PO TABS: 50.0000 ug | ORAL_TABLET | Freq: Every day | ORAL | 3 refills | Status: DC

## 2019-11-08 NOTE — Patient Instructions (Addendum)
° ° ° °If you have lab work done today you will be contacted with your lab results within the next 2 weeks.  If you have not heard from us then please contact us. The fastest way to get your results is to register for My Chart. ° ° °IF you received an x-ray today, you will receive an invoice from [facility]. Please contact [facility] at [phone] with questions or concerns regarding your invoice.  ° °IF you received labwork today, you will receive an invoice from [facility]. Please contact [facility] at [phone] with questions or concerns regarding your invoice.  ° °Our billing staff will not be able to assist you with questions regarding bills from these companies. ° °You will be contacted with the lab results as soon as they are available. The fastest way to get your results is to activate your My Chart account. Instructions are located on the last page of this paperwork. If you have not heard from us regarding the results in 2 weeks, please contact this office. °  ° ° °Hipotiroidismo °Hypothyroidism ° °El hipotiroidismo ocurre cuando la glándula tiroidea no produce la cantidad suficiente de ciertas hormonas (es hipoactiva). La glándula tiroidea es una pequeña glándula ubicada en la parte delantera inferior del cuello, justo delante de la tráquea. Esta glándula produce hormonas que ayudan a controlar la forma en la que el cuerpo usa los alimentos para obtener energía (metabolismo) así como también la función cardíaca y la función cerebral. Estas hormonas también juegan un papel para mantener los huesos fuertes. Cuando la tiroides es hipoactiva, produce muy poca cantidad de las hormonas tiroxina (T4) y triyodotironina (T3). °¿Cuáles son las causas? °Esta afección puede ser causada por lo siguiente: °· Enfermedad de Hashimoto. Se trata de una enfermedad por la cual el sistema del cuerpo encargado de combatir las enfermedades (sistema inmunitario) ataca la glándula tiroidea. Esta es la causa  más frecuente. °· Infecciones virales. °· Embarazo. °· Ciertos medicamentos. °· Defectos congénitos. °· Radioterapias anteriores en la cabeza o el cuello para el cáncer. °· Tratamiento previo con yodo radioactivo. °· Exposición a la radiación en el ambiente, en el pasado. °· Extirpación quirúrgica previa de una parte o de toda la tiroides. °· Problemas con una glándula ubicada en el centro del cerebro (hipófisis). °· Falta de una cantidad suficiente de yodo en la dieta. °¿Qué incrementa el riesgo? °Es más probable que usted sufra esta afección si: °· Es mujer. °· Tiene antecedentes familiares de afecciones tiroideas. °· Usa un medicamento denominado litio. °· Toma medicamentos que afectan el sistema inmunitario (inmunosupresores). °¿Cuáles son los signos o los síntomas? °Los síntomas de esta afección incluyen los siguientes: °· Sensación de falta de energía (letargo). °· Incapacidad para tolerar el frío. °· Aumento de peso que no puede explicarse por un cambio en la dieta o en los hábitos de ejercicio físico. °· Falta de apetito. °· Piel seca. °· Pelo grueso. °· Irregularidades menstruales. °· Ralentización de los procesos de pensamiento. °· Estreñimiento. °· Tristeza o depresión. °¿Cómo se diagnostica? °Esta afección se puede diagnosticar en función de lo siguiente: °· Los síntomas, sus antecedentes médicos y un examen físico. °· Análisis de sangre. °También puede someterse a estudios por imágenes como una ecografía o una resonancia magnética (RM). °¿Cómo se trata? °Esta afección se trata con medicamentos que reemplazan las hormonas tiroideas que el cuerpo no produce. Después de comenzar el tratamiento, pueden pasar varias semanas hasta la desaparición de los síntomas. °Siga estas indicaciones en su casa: °· Tome   los medicamentos de venta libre y los recetados solamente como se lo haya indicado el médico. °· Si empieza a tomar medicamentos nuevos, infórmele al médico. °· Concurra a todas las visitas de control como  se lo haya indicado el médico. Esto es importante. °? A medida que la afección mejora, es posible que haya que modificar las dosis de los medicamentos de hormona tiroidea. °? Tendrá que hacerse análisis de sangre periódicamente, de modo que el médico pueda controlar la afección. °Comuníquese con un médico si: °· Los síntomas no mejoran con el tratamiento. °· Está tomando medicamentos de reemplazo de la tiroides y: °? Suda mucho. °? Tiene temblores. °? Se siente ansioso. °? Baja de peso rápidamente. °? No puede tolerar el calor. °? Tiene cambios emocionales. °? Tiene diarrea. °? Se siente débil. °Solicite ayuda inmediatamente si tiene: °· Dolor en el pecho. °· Latidos cardíacos irregulares. °· Latidos cardíacos rápidos. °· Dificultad para respirar. °Resumen °· El hipotiroidismo ocurre cuando la glándula tiroidea no produce la cantidad suficiente de ciertas hormonas (es hipoactiva). °· Cuando la tiroides es hipoactiva, produce muy poca cantidad de las hormonas tiroxina (T4) y triyodotironina (T3). °· La causa más frecuente es la enfermedad de Hashimoto, una enfermedad por la cual el sistema del cuerpo encargado de combatir las enfermedades (sistema inmunitario) ataca la glándula tiroidea. La afección también puede ser consecuencia de infecciones virales, medicamentos, el embarazo o luego de un tratamiento de radioterapia en la cabeza o el cuello. °· Los síntomas pueden incluir aumento de peso, piel seca, estreñimiento, sensación de no tener energía e incapacidad para tolerar el frío. °· Esta afección se trata con medicamentos que reemplazan las hormonas tiroideas que el cuerpo no produce. °Esta información no tiene como fin reemplazar el consejo del médico. Asegúrese de hacerle al médico cualquier pregunta que tenga. °Document Revised: 08/07/2017 Document Reviewed: 05/26/2017 °Elsevier Patient Education © 2020 [employer] ° °

## 2019-11-08 NOTE — Progress Notes (Signed)
[name] 44 y.o.   Chief Complaint  Patient presents with  . Fatigue    pt is here for thyroid check, due to lack of energy even when she has slept enough the night before, pt gains weight back quickly unless on agressive diet, pt states she feels sleepy about 3pm, denies family htx of thyroid conditions     HISTORY OF PRESENT ILLNESS: This is a 44 y.o. female here for follow-up on thyroid blood results. Recent Results (from the past 2160 hour(s))  Thyroid Panel With TSH     Status: Abnormal   Collection Time: 11/06/19  3:58 PM  Result Value Ref Range   TSH 5.880 (H) 0.450 - 4.500 uIU/mL   T4, Total 7.0 4.5 - 12.0 ug/dL   T3 Uptake Ratio 23 (L) 24 - 39 %   Free Thyroxine Index 1.6 1.2 - 4.9     HPI   Prior to Admission medications   Medication Sig Start Date End Date Taking? Authorizing Provider  butalbital-acetaminophen-caffeine (FIORICET) 50-325-40 MG tablet Take 1-2 tablets by mouth every 6 (six) hours as needed for headache. 10/20/19 10/19/20 Yes Peyton NajjarHopper, David H, MD  hydrOXYzine (ATARAX/VISTARIL) 25 MG tablet Take 1-2 tablets (25-50 mg total) by mouth every 6 (six) hours as needed for itching. 04/20/17  Yes Shade FloodGreene, Jeffrey R, MD  meclizine (ANTIVERT) 25 MG tablet Take 1 tablet (25 mg total) by mouth 3 (three) times daily as needed for dizziness. 10/20/19  Yes Peyton NajjarHopper, David H, MD  ondansetron (ZOFRAN) 4 MG tablet Take 1 tablet (4 mg total) by mouth every 8 (eight) hours as needed for nausea or vomiting. 10/20/19  Yes Peyton NajjarHopper, David H, MD  rizatriptan (MAXALT) 5 MG tablet Take 1 tablet (5 mg total) by mouth as needed for migraine. May repeat in 2 hours if needed 10/20/19  Yes Peyton NajjarHopper, David H, MD  levothyroxine (SYNTHROID) 50 MCG tablet Take 1 tablet (50 mcg total) by mouth daily. 11/08/19   Georgina QuintSagardia, Kannen Moxey Jose, MD    Allergies  Allergen Reactions  . Penicillins Other (See Comments)    childhood    Patient Active Problem List   Diagnosis Date Noted  . Migraine  without aura and with status migrainosus, not intractable 10/20/2019  . Eye discomfort, right 02/21/2018  . Pain and swelling of eyelid of left eye 02/21/2018  . Acute allergic reaction 04/13/2017  . Rash and nonspecific skin eruption 04/13/2017  . BPPV (benign paroxysmal positional vertigo), right 11/25/2016  . Adjustment disorder with mixed anxiety and depressed mood 07/20/2016  . Chronic fatigue 07/20/2016    Past Medical History:  Diagnosis Date  . Anxiety   . BPPV (benign paroxysmal positional vertigo)   . Depression   . Hemorrhoid   . No pertinent past medical history   . Postpartum care and examination of lactating mother 11/20/2010  . Postpartum care following vaginal delivery (4/22) 08/31/2014    Past Surgical History:  Procedure Laterality Date  . CERVICAL CERCLAGE    . CERVICAL CERCLAGE N/A 02/21/2014   Procedure: Modified McDonald CERVICAL CERCLAGE ;  Surgeon: Genia DelMarie-Lyne Lavoie, MD;  Location: [facility];  Service: Gynecology;  Laterality: N/A;  EDD: 09/05/14  . EVALUATION UNDER ANESTHESIA WITH HEMORRHOIDECTOMY N/A 01/28/2017   Procedure: EXAM UNDER ANESTHESIA WITH HEMORRHOIDECTOMY;  Surgeon: Abigail MiyamotoBlackman, Douglas, MD;  Location: [facility];  Service: General;  Laterality: N/A;  . HEMORRHOID SURGERY     after delivery  . LAPAROSCOPIC TUBAL LIGATION Bilateral 09/11/2014   Procedure: LAPAROSCOPIC TUBAL  LIGATION With Cautery;  Surgeon: Genia Del, MD;  Location: [facility];  Service: Gynecology;  Laterality: Bilateral;  . TUBAL LIGATION      Social History   Socioeconomic History  . Marital status: Married    Spouse name: Not on file  . Number of children: Not on file  . Years of education: Not on file  . Highest education level: Not on file  Occupational History  . Not on file  Tobacco Use  . Smoking status: Never Smoker  . Smokeless tobacco: Never Used  Substance and Sexual Activity  . Alcohol use: Yes    Comment: social  . Drug use: No  . Sexual  activity: Yes    Birth control/protection: Surgical    Comment: BTL  Other Topics Concern  . Not on file  Social History Narrative  . Not on file   Social Determinants of Health   Financial Resource Strain:   . Difficulty of Paying Living Expenses:   Food Insecurity:   . Worried About Programme researcher, broadcasting/film/video in the Last Year:   . Barista in the Last Year:   Transportation Needs:   . Freight forwarder (Medical):   Marland Kitchen Lack of Transportation (Non-Medical):   Physical Activity:   . Days of Exercise per Week:   . Minutes of Exercise per Session:   Stress:   . Feeling of Stress :   Social Connections:   . Frequency of Communication with Friends and Family:   . Frequency of Social Gatherings with Friends and Family:   . Attends Religious Services:   . Active Member of Clubs or Organizations:   . Attends Banker Meetings:   Marland Kitchen Marital Status:   Intimate Partner Violence:   . Fear of Current or Ex-Partner:   . Emotionally Abused:   Marland Kitchen Physically Abused:   . Sexually Abused:     Family History  Problem Relation Age of Onset  . Heart disease Mother   . Hyperlipidemia Mother      Review of Systems  Constitutional: Positive for malaise/fatigue.  HENT: Negative for congestion and sore throat.   Respiratory: Negative for cough and shortness of breath.   Cardiovascular: Negative for chest pain and palpitations.  Gastrointestinal: Negative for abdominal pain, constipation, nausea and vomiting.  Genitourinary: Negative.   Neurological: Positive for weakness.  All other systems reviewed and are negative.   Today's Vitals   11/08/19 1535  BP: 125/78  Pulse: 88  Resp: 14  Temp: 98.4 F (36.9 C)  TempSrc: Temporal  SpO2: 97%  Weight: 193 lb 6.4 oz (87.7 kg)  Height: 5\' 8"  (1.727 m)   Body mass index is 29.41 kg/m.  Physical Exam Vitals reviewed.  Constitutional:      Appearance: Normal appearance.  HENT:     Head: Normocephalic.  Eyes:      Extraocular Movements: Extraocular movements intact.     Conjunctiva/sclera: Conjunctivae normal.     Pupils: Pupils are equal, round, and reactive to light.  Cardiovascular:     Rate and Rhythm: Normal rate and regular rhythm.     Pulses: Normal pulses.     Heart sounds: Normal heart sounds.  Pulmonary:     Effort: Pulmonary effort is normal.     Breath sounds: Normal breath sounds.  Musculoskeletal:        General: Normal range of motion.     Cervical back: Normal range of motion and neck supple.  Skin:  General: Skin is warm and dry.     Capillary Refill: Capillary refill takes less than 2 seconds.  Neurological:     General: No focal deficit present.     Mental Status: She is alert and oriented to person, place, and time.  Psychiatric:        Mood and Affect: Mood normal.        Behavior: Behavior normal.      ASSESSMENT & PLAN: [name] was seen today for fatigue.  Diagnoses and all orders for this visit:  Hypothyroidism, unspecified type -     levothyroxine (SYNTHROID) 50 MCG tablet; Take 1 tablet (50 mcg total) by mouth daily.    Patient Instructions       If you have lab work done today you will be contacted with your lab results within the next 2 weeks.  If you have not heard from [place] then please contact us. The fastest way to get your results is to register for My Chart.   IF you received an x-ray today, you will receive an invoice from [facility]. Please contact [facility] at [phone] with questions or concerns regarding your invoice.   IF you received labwork today, you will receive an invoice from [place]. Please contact [facility] at [phone] with questions or concerns regarding your invoice.   Our billing staff will not be able to assist you with questions regarding bills from these companies.  You will be contacted with the lab results as soon as they are available. The fastest way to get your results is to activate your My  Chart account. Instructions are located on the last page of this paperwork. If you have not heard from [place] regarding the results in 2 weeks, please contact this office.     Hipotiroidismo Hypothyroidism  El hipotiroidismo ocurre cuando la glndula tiroidea no produce la cantidad suficiente de ciertas hormonas (es hipoactiva). La glndula tiroidea es una pequea glndula ubicada en la parte delantera inferior del cuello, justo delante de la trquea. Esta glndula produce hormonas que ayudan a Scientist, physiological forma en la que el cuerpo [place] los alimentos para obtener energa (metabolismo) as como tambin la funcin cardaca y la funcin cerebral. Estas hormonas tambin juegan un papel para [employer] huesos fuertes. Cuando la tiroides es hipoactiva, produce muy poca cantidad de las hormonas tiroxina (T4) y triyodotironina (T3). Cules son las causas? Esta afeccin puede ser causada por lo siguiente:  Enfermedad de Hashimoto. Se trata de una enfermedad por la cual el sistema del cuerpo encargado de combatir las enfermedades (sistema inmunitario) ataca la glndula tiroidea. Esta es la causa ms frecuente.  Infecciones virales.  Embarazo.  Ciertos medicamentos.  Defectos congnitos.  Radioterapias anteriores en la cabeza o el cuello para Management consultant.  Tratamiento previo con yodo radioactivo.  Exposicin a la radiacin en el ambiente, en el pasado.  Extirpacin quirrgica previa de una parte o de toda la tiroides.  Problemas con Neomia Dear glndula ubicada en el centro del cerebro (hipfisis).  Falta de una cantidad suficiente de yodo en la dieta. Qu incrementa el riesgo? Es ms probable que usted sufra esta afeccin si:  Es mujer.  Tiene antecedentes familiares de afecciones tiroideas.  [place] un medicamento denominado litio.  Toma medicamentos que afectan el sistema inmunitario (inmunosupresores). Cules son los signos o los sntomas? Los sntomas de esta afeccin [employer]  siguientes:  Sensacin de falta de energa ([place]).  Incapacidad para tolerar el fro.  Aumento de [employer] no puede  explicarse por un cambio en la dieta o en los hbitos de ejercicio fsico.  Falta de apetito.  Piel seca.  Pelo grueso.  Irregularidades menstruales.  Ralentizacin de los procesos de pensamiento.  Estreimiento.  Tristeza o depresin. Cmo se diagnostica? Esta afeccin se puede diagnosticar en funcin de lo siguiente:  Los sntomas, sus antecedentes mdicos y un examen fsico.  Anlisis de sangre. Tambin puede someterse a estudios por imgenes como una ecografa o una resonancia magntica (RM). Cmo se trata? Esta afeccin se trata con medicamentos que reemplazan las hormonas tiroideas que el cuerpo no produce. Despus de Microbiologist, pueden pasar varias semanas hasta la desaparicin de los sntomas. Siga estas indicaciones en su casa:  Tome los medicamentos de venta libre y los recetados solamente como se lo haya indicado el mdico.  Si empieza a tomar medicamentos nuevos, infrmele al mdico.  Oceanographer a todas las visitas de control como se lo haya indicado el mdico. Esto es importante. ? A medida que la afeccin mejora, es posible que haya que modificar las dosis de los medicamentos de hormona tiroidea. ? Tendr que hacerse anlisis de sangre peridicamente, de modo que el mdico pueda Passenger transport manager. Comunquese con un mdico si:  Los sntomas no mejoran con Scientist, research (medical).  Est tomando medicamentos de reemplazo de la tiroides y: ? Wendall Stade. ? Tiene temblores. ? Se siente ansioso. ? [place] de [place] rpidamente. ? No puede Patent examiner. ? Tiene cambios emocionales. ? Tiene diarrea. ? Se siente dbil. Solicite ayuda inmediatamente si tiene:  Journalist, newspaper.  Latidos cardacos irregulares.  Latidos cardacos rpidos.  Dificultad para respirar. Resumen  El hipotiroidismo ocurre cuando la glndula tiroidea  no produce la cantidad suficiente de ciertas hormonas (es hipoactiva).  Cuando la tiroides es hipoactiva, produce muy poca cantidad de las hormonas tiroxina (T4) y triyodotironina (T3).  La causa ms frecuente es la enfermedad de Hashimoto, una enfermedad por la cual el sistema del cuerpo encargado de combatir las enfermedades (sistema inmunitario) ataca la glndula tiroidea. La afeccin tambin puede ser consecuencia de infecciones virales, medicamentos, el embarazo o luego de un tratamiento de radioterapia en la cabeza o el cuello.  Los sntomas pueden incluir aumento de [place], piel seca, estreimiento, sensacin de no tener energa e incapacidad para [employer] fro.  Esta afeccin se trata con medicamentos que reemplazan las hormonas tiroideas que el cuerpo no produce. Esta informacin no tiene Theme park manager el consejo del mdico. Asegrese de hacerle al mdico cualquier pregunta que tenga. Document Revised: 08/07/2017 Document Reviewed: 05/26/2017 Elsevier Patient Education  2020 Elsevier Inc.      Edwina Barth, MD [facility]

## 2020-07-03 ENCOUNTER — Other Ambulatory Visit: Payer: Self-pay

## 2020-07-03 DIAGNOSIS — Z20822 Contact with and (suspected) exposure to covid-19: Secondary | ICD-10-CM

## 2020-07-04 LAB — NOVEL CORONAVIRUS, NAA: SARS-CoV-2, NAA: NOT DETECTED

## 2020-07-04 LAB — SARS-COV-2, NAA 2 DAY TAT

## 2021-12-11 DIAGNOSIS — E039 Hypothyroidism, unspecified: Secondary | ICD-10-CM | POA: Insufficient documentation

## 2022-07-22 ENCOUNTER — Telehealth: Payer: Self-pay | Admitting: Hematology and Oncology

## 2022-07-22 NOTE — Telephone Encounter (Signed)
scheduled per 3/13 referral , pt has been called and confirmed date and time. Pt is aware of location and to arrive early for check in

## 2022-08-13 ENCOUNTER — Other Ambulatory Visit: Payer: Self-pay

## 2022-08-13 ENCOUNTER — Inpatient Hospital Stay: Payer: Self-pay

## 2022-08-13 ENCOUNTER — Inpatient Hospital Stay: Payer: Self-pay | Attending: Hematology and Oncology | Admitting: Hematology and Oncology

## 2022-08-13 VITALS — BP 119/72 | HR 76 | Temp 97.8°F | Resp 17 | Ht 68.0 in | Wt 184.1 lb

## 2022-08-13 DIAGNOSIS — D709 Neutropenia, unspecified: Secondary | ICD-10-CM

## 2022-08-13 LAB — CMP (CANCER CENTER ONLY)
ALT: 22 U/L (ref 0–44)
AST: 23 U/L (ref 15–41)
Albumin: 4.6 g/dL (ref 3.5–5.0)
Alkaline Phosphatase: 52 U/L (ref 38–126)
Anion gap: 7 (ref 5–15)
BUN: 19 mg/dL (ref 6–20)
CO2: 26 mmol/L (ref 22–32)
Calcium: 9.8 mg/dL (ref 8.9–10.3)
Chloride: 104 mmol/L (ref 98–111)
Creatinine: 0.66 mg/dL (ref 0.44–1.00)
GFR, Estimated: >60 mL/min (ref >60)
Glucose, Bld: 89 mg/dL (ref 70–99)
Potassium: 3.9 mmol/L (ref 3.5–5.1)
Sodium: 137 mmol/L (ref 135–145)
Total Bilirubin: 0.3 mg/dL (ref 0.3–1.2)
Total Protein: 7.6 g/dL (ref 6.5–8.1)

## 2022-08-13 LAB — HEPATITIS B CORE ANTIBODY, TOTAL: Hep B Core Total Ab: NONREACTIVE

## 2022-08-13 LAB — CBC WITH DIFFERENTIAL (CANCER CENTER ONLY)
Abs Immature Granulocytes: 0.01 10*3/uL (ref 0.00–0.07)
Basophils Absolute: 0 10*3/uL (ref 0.0–0.1)
Basophils Relative: 1 %
Eosinophils Absolute: 0.2 10*3/uL (ref 0.0–0.5)
Eosinophils Relative: 3 %
HCT: 39.8 % (ref 36.0–46.0)
Hemoglobin: 13.2 g/dL (ref 12.0–15.0)
Immature Granulocytes: 0 %
Lymphocytes Relative: 39 %
Lymphs Abs: 2 10*3/uL (ref 0.7–4.0)
MCH: 27.6 pg (ref 26.0–34.0)
MCHC: 33.2 g/dL (ref 30.0–36.0)
MCV: 83.1 fL (ref 80.0–100.0)
Monocytes Absolute: 0.4 10*3/uL (ref 0.1–1.0)
Monocytes Relative: 9 %
Neutro Abs: 2.6 10*3/uL (ref 1.7–7.7)
Neutrophils Relative %: 48 %
Platelet Count: 289 10*3/uL (ref 150–400)
RBC: 4.79 MIL/uL (ref 3.87–5.11)
Smear Review: NORMAL
WBC Count: 5.2 10*3/uL (ref 4.0–10.5)
nRBC: 0 % (ref 0.0–0.2)

## 2022-08-13 LAB — LACTATE DEHYDROGENASE: LDH: 149 U/L (ref 98–192)

## 2022-08-13 LAB — HEPATITIS B SURFACE ANTIGEN: Hepatitis B Surface Ag: NONREACTIVE

## 2022-08-13 LAB — SEDIMENTATION RATE: Sed Rate: 2 mm/hr (ref 0–22)

## 2022-08-13 LAB — HEPATITIS C ANTIBODY: HCV Ab: NONREACTIVE

## 2022-08-13 LAB — C-REACTIVE PROTEIN: CRP: 1.3 mg/dL — ABNORMAL HIGH (ref <1.0)

## 2022-08-13 LAB — TSH: TSH: 3.108 u[IU]/mL (ref 0.350–4.500)

## 2022-08-13 LAB — FOLATE: Folate: 29.9 ng/mL (ref >5.9)

## 2022-08-13 LAB — HIV ANTIBODY (ROUTINE TESTING W REFLEX): HIV Screen 4th Generation wRfx: NONREACTIVE

## 2022-08-13 LAB — HEPATITIS B SURFACE ANTIBODY,QUALITATIVE: Hep B S Ab: NONREACTIVE

## 2022-08-13 LAB — VITAMIN B12: Vitamin B-12: 563 pg/mL (ref 180–914)

## 2022-08-13 NOTE — Progress Notes (Signed)
[place] [phone]   [phone] [place] NOTE  Patient Care Team: Horald Pollen, MD as PCP - General (Internal Medicine)  Hematological/Oncological History # Leukopenia 06/19/2022: WBC 3.1, Hgb 10.5, MCV 70.7, Plt 342 07/21/2022: WBC 3.1, Hgb 12.3, MCV 80.2, Plt 252 08/13/2022: establish care with Dr. Lorenso Courier   CHIEF COMPLAINTS/PURPOSE OF CONSULTATION:  "Leukopenia  HISTORY OF PRESENTING ILLNESS:  [name] 47 y.o. female with medical history significant for anxiety, depression, and hemorrhoids who presents for evaluation of leukopenia.  On review of the previous records [name] had labs collected on 06/19/2022 which showed white blood cell count 3.1, hemoglobin 10.5, MCV 70.7, and platelets of 342.  Most recently on 07/21/2022 patient had white blood cell count 3.1, hemoglobin 12.3, MCV 80.2, and platelets of 252.  Due to concern for these findings the patient was referred to hematology for further evaluation and management.  On exam today [name] she was undergoing a routine follow-up visit when she was found to have a low white blood cell count.  She had her labs rechecked in 1 month with zinc and vitamin C supplementation in the interim with no improvement.  She reports that she has not been having any issues with infections, fevers, chills, sweats.  She reports that she does not have any dietary restrictions but has been recently lifting at the gym and trying to increase muscle content.  She is eating more protein and does enjoy eating red meat, though she mostly eats chicken.  She reports that she eats red meat approximately once per week.  She also does enjoy her dark green leafy vegetables.  She notes that she has no history of liver disease.  On further discussion she reports her mother had ovarian cancer and her father had kidney disease.  She has 4 siblings as well as 3 healthy children.  She  notes that she is a never smoker and drinks alcohol every few months.  She notes that she currently works at home and with her [employer].  She does have a small bulge in her left arm that started when she began working out.  She does have some occasional palpitations when trying to sleep.  She is also concerned she may be having some issues with proptosis.  She otherwise denies any fevers, chills, sweats, nausea, vomiting or diarrhea.  A full 10 point ROS is otherwise negative.  MEDICAL HISTORY:  Past Medical History:  Diagnosis Date   Anxiety    BPPV (benign paroxysmal positional vertigo)    Depression    Hemorrhoid    No pertinent past medical history    Postpartum care and examination of lactating mother 11/20/2010   Postpartum care following vaginal delivery (4/22) 08/31/2014    SURGICAL HISTORY: Past Surgical History:  Procedure Laterality Date   CERVICAL CERCLAGE     CERVICAL CERCLAGE N/A 02/21/2014   Procedure: Modified [place] CERVICAL CERCLAGE ;  Surgeon: Princess Bruins, MD;  Location: [facility];  Service: Gynecology;  Laterality: N/A;  EDD: 09/05/14   EVALUATION UNDER ANESTHESIA WITH HEMORRHOIDECTOMY N/A 01/28/2017   Procedure: EXAM UNDER ANESTHESIA WITH HEMORRHOIDECTOMY;  Surgeon: Coralie Keens, MD;  Location: [facility];  Service: General;  Laterality: N/A;   HEMORRHOID SURGERY     after delivery   LAPAROSCOPIC TUBAL LIGATION Bilateral 09/11/2014   Procedure: LAPAROSCOPIC TUBAL LIGATION With Cautery;  Surgeon: Princess Bruins, MD;  Location: [facility];  Service: Gynecology;  Laterality:  Bilateral;   TUBAL LIGATION      SOCIAL HISTORY: Social History   Socioeconomic History   Marital status: Married    Spouse name: Not on file   Number of children: Not on file   Years of education: Not on file   Highest education level: Not on file  Occupational History   Not on file  Tobacco Use   Smoking status: Never   Smokeless tobacco: Never   Substance and Sexual Activity   Alcohol use: Yes    Comment: social   Drug use: No   Sexual activity: Yes    Birth control/protection: Surgical    Comment: BTL  Other Topics Concern   Not on file  Social History Narrative   Not on file   Social Determinants of Health   Financial Resource Strain: Not on file  Food Insecurity: Not on file  Transportation Needs: Not on file  Physical Activity: Not on file  Stress: Not on file  Social Connections: Not on file  Intimate Partner Violence: Not on file    FAMILY HISTORY: Family History  Problem Relation Age of Onset   Heart disease Mother    Hyperlipidemia Mother     ALLERGIES:  is allergic to penicillins.  MEDICATIONS:  Current Outpatient Medications  Medication Sig Dispense Refill   Ascorbic Acid (VITAMIN C) 100 MG tablet Take 100 mg by mouth daily.     Magnesium 100 MG CAPS Take by mouth.     Multiple Vitamin (MULTIVITAMIN) capsule Take 1 capsule by mouth daily.     Potassium 99 MG TABS Take by mouth.     Zinc Sulfate (ZINC 15 PO) Take by mouth.     No current facility-administered medications for this visit.    REVIEW OF SYSTEMS:   Constitutional: ( - ) fevers, ( - )  chills , ( - ) night sweats Eyes: ( - ) blurriness of vision, ( - ) double vision, ( - ) watery eyes Ears, nose, mouth, throat, and face: ( - ) mucositis, ( - ) sore throat Respiratory: ( - ) cough, ( - ) dyspnea, ( - ) wheezes Cardiovascular: ( - ) palpitation, ( - ) chest discomfort, ( - ) lower extremity swelling Gastrointestinal:  ( - ) nausea, ( - ) heartburn, ( - ) change in bowel habits Skin: ( - ) abnormal skin rashes Lymphatics: ( - ) new lymphadenopathy, ( - ) easy bruising Neurological: ( - ) numbness, ( - ) tingling, ( - ) new weaknesses Behavioral/Psych: ( - ) mood change, ( - ) new changes  All other systems were reviewed with the patient and are negative.  PHYSICAL EXAMINATION:  Vitals:   08/13/22 1304  BP: 119/72  Pulse: 76   Resp: 17  Temp: 97.8 F (36.6 C)  SpO2: 100%   Filed Weights   08/13/22 1304  Weight: 184 lb 1.6 oz (83.5 kg)    GENERAL: well appearing middle-aged Hispanic female in NAD  SKIN: skin color, texture, turgor are normal, no rashes or significant lesions EYES: conjunctiva are pink and non-injected, sclera clear LUNGS: clear to auscultation and percussion with normal breathing effort HEART: regular rate & rhythm and no murmurs and no lower extremity edema Musculoskeletal: no cyanosis of digits and no clubbing  PSYCH: alert & oriented x 3, fluent speech NEURO: no focal motor/sensory deficits  LABORATORY DATA:  I have reviewed the data as listed    Latest Ref Rng & Units 08/13/2022  1:47 PM 04/27/2017   10:48 AM 10/28/2016    9:18 AM  CBC  WBC 4.0 - 10.5 K/uL 5.2  7.9  7.1   Hemoglobin 12.0 - 15.0 g/dL 13.2  11.2  12.3   Hematocrit 36.0 - 46.0 % 39.8  34.6  37.7   Platelets 150 - 400 K/uL 289  384  279        Latest Ref Rng & Units 08/13/2022    1:47 PM 04/27/2017   10:48 AM 10/28/2016    9:18 AM  CMP  Glucose 70 - 99 mg/dL 89  77  110   BUN 6 - 20 mg/dL 19  16  7    Creatinine 0.44 - 1.00 mg/dL 0.66  0.59  0.56   Sodium 135 - 145 mmol/L 137  143  137   Potassium 3.5 - 5.1 mmol/L 3.9  4.5  4.1   Chloride 98 - 111 mmol/L 104  104  107   CO2 22 - 32 mmol/L 26  27  21    Calcium 8.9 - 10.3 mg/dL 9.8  9.3  8.7   Total Protein 6.5 - 8.1 g/dL 7.6  6.7  7.7   Total Bilirubin 0.3 - 1.2 mg/dL 0.3  <0.2  0.2   Alkaline Phos 38 - 126 U/L 52  69  65   AST 15 - 41 U/L 23  18  32   ALT 0 - 44 U/L 22  27  27       ASSESSMENT & PLAN [name] 48 y.o. female with medical history significant for anxiety, depression, and hemorrhoids who presents for evaluation of leukopenia.  After review of the labs, review of the records, and discussion with the patient the patients findings are most consistent with leukopenia of unclear etiology  The differential for neutropenia  includes nutritional deficiency, inflammatory disorder, medication side effect, infectious etiology, or congenital condition (such as benign ethnic neutropenia). The patient is not taking any medications known to cause neutropenia. Workup for this condition includes viral serologies (HIV, Hep B and Hep C),  vitamin b12/folate, and inflammatory markers with ESR and CRP.    A common cause for low WBC is benign ethnic neutropenia (BEN). This is a condition whereby individuals of African or Mediterranean descent have lower WBC than the general population. The condition typically has an absolute neutrophil count (ANC) between 1000-1500 with no recurrent infections. Patients with this condition have normal functioning immune systems and have no consequences as a result of their low ANC. BEN is a diagnosis of exclusion, so it would require the full above workup to make.    #Neutropenia, etiology unclear  --repeat CBC and CMP  --infectious serology testing with Hep B, Hep C, and HIV  --nutritional evaluation with Vitamin b12, folate  --inflammatory workup with ESR and CRP  --RTC in 6 month's time or sooner if there is an issue with the above labs.   #Concern for Hypothyroidism -- Patient previously prescribed levothyroxine 50 mg p.o. daily, but does not take this -- Patient notes she is having issues with proptosis as well as palpitations at night. -- Will order TSH and T4 today.  Orders Placed This Encounter  Procedures   CBC with Differential ([facility] Only)    Standing Status:   Future    Number of Occurrences:   1    Standing Expiration Date:   08/13/2023   CMP ([facility] only)    Standing Status:   Future    Number  of Occurrences:   1    Standing Expiration Date:   08/13/2023   Lactate dehydrogenase (LDH)    Standing Status:   Future    Number of Occurrences:   1    Standing Expiration Date:   08/13/2023   Hepatitis B core antibody, total    Standing Status:   Future    Number of  Occurrences:   1    Standing Expiration Date:   08/13/2023   Hepatitis B surface antibody    Standing Status:   Future    Number of Occurrences:   1    Standing Expiration Date:   08/13/2023   Hepatitis B surface antigen    Standing Status:   Future    Number of Occurrences:   1    Standing Expiration Date:   08/13/2023   Hepatitis C antibody    Standing Status:   Future    Number of Occurrences:   1    Standing Expiration Date:   08/13/2023   HIV antibody (with reflex)    Standing Status:   Future    Number of Occurrences:   1    Standing Expiration Date:   08/13/2023   Sedimentation rate    Standing Status:   Future    Number of Occurrences:   1    Standing Expiration Date:   08/13/2023   C-reactive protein    Standing Status:   Future    Number of Occurrences:   1    Standing Expiration Date:   08/13/2023   Vitamin B12    Standing Status:   Future    Number of Occurrences:   1    Standing Expiration Date:   08/13/2023   Folate, Serum    Standing Status:   Future    Number of Occurrences:   1    Standing Expiration Date:   08/13/2023   TSH    Standing Status:   Future    Number of Occurrences:   1    Standing Expiration Date:   08/13/2023   T4    Standing Status:   Future    Number of Occurrences:   1    Standing Expiration Date:   08/13/2023    All questions were answered. The patient knows to call the clinic with any problems, questions or concerns.  A total of more than 60 minutes were spent on this encounter with face-to-face time and non-face-to-face time, including preparing to see the patient, ordering tests and/or medications, counseling the patient and coordination of care as outlined above.   Ledell Peoples, MD Department of Hematology/Oncology [place] [facility] Phone: [phone] Pager: [phone] Email: [email or web address]  08/13/2022 3:25 PM

## 2022-08-14 LAB — T4: T4, Total: 6.2 ug/dL (ref 4.5–12.0)

## 2022-08-21 ENCOUNTER — Telehealth: Payer: Self-pay

## 2022-08-21 NOTE — Telephone Encounter (Signed)
Answered phone call from patient who was curious about her lab results from 08/13/2022 when she saw Dr. Leonides Schanz for abnormal lab values.  Reviewed labs with her and she has re-established care with her PCP starting on 4/18 so he will be able to review these labs. Only lab value slightly abnormal was CRP which was only slightly elevated. Advised her that all values will be in [place] and she is going to try to reset her password since she is activated but has not logged in since 2022.  She wanted me to email scan her lab results to her at [email or web address]. Lorayne Marek, RN

## 2022-08-27 ENCOUNTER — Ambulatory Visit (INDEPENDENT_AMBULATORY_CARE_PROVIDER_SITE_OTHER): Payer: Self-pay | Admitting: Emergency Medicine

## 2022-08-27 ENCOUNTER — Encounter: Payer: Self-pay | Admitting: Emergency Medicine

## 2022-08-27 VITALS — BP 120/80 | HR 83 | Temp 98.3°F | Ht 68.0 in | Wt 184.0 lb

## 2022-08-27 DIAGNOSIS — Z130 Encounter for screening for diseases of the blood and blood-forming organs and certain disorders involving the immune mechanism: Secondary | ICD-10-CM

## 2022-08-27 DIAGNOSIS — Z13220 Encounter for screening for lipoid disorders: Secondary | ICD-10-CM

## 2022-08-27 DIAGNOSIS — Z13228 Encounter for screening for other metabolic disorders: Secondary | ICD-10-CM

## 2022-08-27 DIAGNOSIS — F320 Major depressive disorder, single episode, mild: Secondary | ICD-10-CM | POA: Insufficient documentation

## 2022-08-27 DIAGNOSIS — N951 Menopausal and female climacteric states: Secondary | ICD-10-CM | POA: Insufficient documentation

## 2022-08-27 DIAGNOSIS — Z1329 Encounter for screening for other suspected endocrine disorder: Secondary | ICD-10-CM

## 2022-08-27 DIAGNOSIS — Z1321 Encounter for screening for nutritional disorder: Secondary | ICD-10-CM

## 2022-08-27 LAB — LIPID PANEL
Cholesterol: 188 mg/dL (ref 0–200)
HDL: 43.3 mg/dL (ref >39.00)
LDL Cholesterol: 121 mg/dL — ABNORMAL HIGH (ref 0–99)
NonHDL: 144.97
Total CHOL/HDL Ratio: 4
Triglycerides: 121 mg/dL (ref 0.0–149.0)
VLDL: 24.2 mg/dL (ref 0.0–40.0)

## 2022-08-27 LAB — HEMOGLOBIN A1C: Hgb A1c MFr Bld: 5.2 % (ref 4.6–6.5)

## 2022-08-27 LAB — VITAMIN D 25 HYDROXY (VIT D DEFICIENCY, FRACTURES): VITD: 22.61 ng/mL — ABNORMAL LOW (ref 30.00–100.00)

## 2022-08-27 MED ORDER — DULOXETINE HCL 30 MG PO CPEP
30.0000 mg | ORAL_CAPSULE | Freq: Every day | ORAL | 1 refills | Status: AC
Start: 2022-08-27 — End: ?

## 2022-08-27 NOTE — Assessment & Plan Note (Signed)
Contributing to her overall symptoms Recommend to follow-up with her gynecologist

## 2022-08-27 NOTE — Assessment & Plan Note (Signed)
Active and affecting quality of life. Recommend to start Cymbalta 30 mg daily. Follow-up in 3 months

## 2022-08-27 NOTE — Patient Instructions (Addendum)

## 2022-08-27 NOTE — Progress Notes (Signed)
[name] 47 y.o.   Chief Complaint  Patient presents with   Establish Care    Pt states she went to obgyn and they mentioned her having low blood count.she wants to talk about her hormones.    HISTORY OF PRESENT ILLNESS: This is a 47 y.o. female here to reestablish care with me Recently went to see her OB/GYN doctor who found her to be neutropenic.  Was referred to hematologist Was evaluated by hematologist who did extensive blood work.  Results reviewed with patient.  Unremarkable labs with acceptable values. Patient is perimenopausal and feels depressed.  Also has issues with anxiety. No other complaints or medical concerns today.  HPI   Prior to Admission medications   Medication Sig Start Date End Date Taking? Authorizing Provider  Ascorbic Acid (VITAMIN C) 100 MG tablet Take 100 mg by mouth daily.   Yes [provider]  DULoxetine (CYMBALTA) 30 MG capsule Take 1 capsule (30 mg total) by mouth daily. 08/27/22  Yes SagardiaEilleen Kempf, MD  Magnesium 100 MG CAPS Take by mouth.   Yes [provider]  Multiple Vitamin (MULTIVITAMIN) capsule Take 1 capsule by mouth daily.   Yes [provider]  Potassium 99 MG TABS Take by mouth.   Yes [provider]  Zinc Sulfate (ZINC 15 PO) Take by mouth.   Yes [provider]    Allergies  Allergen Reactions   Penicillins Other (See Comments)    childhood    Patient Active Problem List   Diagnosis Date Noted   Hypothyroidism 12/11/2021   Migraine without aura and with status migrainosus, not intractable 10/20/2019   Adjustment disorder with mixed anxiety and depressed mood 07/20/2016   Chronic fatigue 07/20/2016    Past Medical History:  Diagnosis Date   Anxiety    BPPV (benign paroxysmal positional vertigo)    Depression    Hemorrhoid    No pertinent past medical history    Postpartum care and examination of lactating mother 11/20/2010   Postpartum care following  vaginal delivery (4/22) 08/31/2014    Past Surgical History:  Procedure Laterality Date   CERVICAL CERCLAGE     CERVICAL CERCLAGE N/A 02/21/2014   Procedure: Modified McDonald CERVICAL CERCLAGE ;  Surgeon: Genia Del, MD;  Location: [facility];  Service: Gynecology;  Laterality: N/A;  EDD: 09/05/14   EVALUATION UNDER ANESTHESIA WITH HEMORRHOIDECTOMY N/A 01/28/2017   Procedure: EXAM UNDER ANESTHESIA WITH HEMORRHOIDECTOMY;  Surgeon: Abigail Miyamoto, MD;  Location: [facility];  Service: General;  Laterality: N/A;   HEMORRHOID SURGERY     after delivery   LAPAROSCOPIC TUBAL LIGATION Bilateral 09/11/2014   Procedure: LAPAROSCOPIC TUBAL LIGATION With Cautery;  Surgeon: Genia Del, MD;  Location: [facility];  Service: Gynecology;  Laterality: Bilateral;   TUBAL LIGATION      Social History   Socioeconomic History   Marital status: Married    Spouse name: Not on file   Number of children: Not on file   Years of education: Not on file   Highest education level: Not on file  Occupational History   Not on file  Tobacco Use   Smoking status: Never   Smokeless tobacco: Never  Substance and Sexual Activity   Alcohol use: Yes    Comment: social   Drug use: No   Sexual activity: Yes    Birth control/protection: Surgical    Comment: BTL  Other Topics Concern   Not on file  Social History Narrative  Not on file   Social Determinants of Health   Financial Resource Strain: Not on file  Food Insecurity: Not on file  Transportation Needs: Not on file  Physical Activity: Not on file  Stress: Not on file  Social Connections: Not on file  Intimate Partner Violence: Not on file    Family History  Problem Relation Age of Onset   Heart disease Mother    Hyperlipidemia Mother      Review of Systems  Constitutional: Negative.  Negative for chills and fever.  HENT: Negative.  Negative for congestion and sore throat.   Respiratory: Negative.  Negative for cough and  shortness of breath.   Cardiovascular: Negative.  Negative for chest pain and palpitations.  Gastrointestinal:  Negative for abdominal pain, diarrhea, nausea and vomiting.  Genitourinary: Negative.  Negative for dysuria and hematuria.  Skin: Negative.  Negative for rash.  Neurological: Negative.  Negative for dizziness and headaches.  Psychiatric/Behavioral:  Positive for depression. The patient is nervous/anxious.   All other systems reviewed and are negative.  Vitals:   08/27/22 1309  BP: 120/80  Pulse: 83  Temp: 98.3 F (36.8 C)  SpO2: 98%    Physical Exam Vitals reviewed.  Constitutional:      Appearance: Normal appearance.  HENT:     Head: Normocephalic.     Mouth/Throat:     Mouth: Mucous membranes are moist.     Pharynx: Oropharynx is clear.  Eyes:     Extraocular Movements: Extraocular movements intact.     Conjunctiva/sclera: Conjunctivae normal.     Pupils: Pupils are equal, round, and reactive to light.  Cardiovascular:     Rate and Rhythm: Normal rate and regular rhythm.     Pulses: Normal pulses.     Heart sounds: Normal heart sounds.  Pulmonary:     Effort: Pulmonary effort is normal.     Breath sounds: Normal breath sounds.  Musculoskeletal:     Cervical back: No tenderness.  Lymphadenopathy:     Cervical: No cervical adenopathy.  Neurological:     Mental Status: She is alert.  Psychiatric:        Mood and Affect: Mood normal.        Behavior: Behavior normal.    ASSESSMENT & PLAN: Problem List Items Addressed This Visit       Other   Current mild episode of major depressive disorder without prior episode - Primary    Active and affecting quality of life. Recommend to start Cymbalta 30 mg daily. Follow-up in 3 months      Relevant Medications   DULoxetine (CYMBALTA) 30 MG capsule   Perimenopausal    Contributing to her overall symptoms Recommend to follow-up with her gynecologist      Other Visit Diagnoses     Screening for lipoid  disorders       Relevant Orders   Lipid panel (Completed)   Screening for endocrine, nutritional, metabolic and immunity disorder       Relevant Orders   Hemoglobin A1c (Completed)   VITAMIN D 25 Hydroxy (Vit-D Deficiency, Fractures) (Completed)      Patient Instructions  Mantenimiento de Radiographer, therapeutic en las mujeres Health Maintenance, Female Adoptar un estilo de vida saludable y recibir atencin preventiva son importantes para promover la salud y Counsellor. Consulte al mdico sobre: El esquema adecuado para hacerse pruebas y exmenes peridicos. Cosas que puede hacer por su cuenta para prevenir enfermedades y [place] sano. Qu debo saber [employer],  el peso y el ejercicio? Consuma una dieta saludable  Consuma una dieta que incluya muchas verduras, frutas, productos lcteos con bajo contenido de [place] y Associate Professor. No consuma muchos alimentos ricos en grasas slidas, azcares agregados o sodio. Mantenga un peso saludable El ndice de masa muscular [facility]) se [place] para identificar problemas de [place]. Proporciona una estimacin de la grasa corporal basndose en el peso y la altura. Su mdico puede ayudarle a Engineer, site [facility]MC y a Personnel officer o Pharmacologist un peso saludable. Haga ejercicio con regularidad Haga ejercicio con regularidad. Esta es una de las prcticas ms importantes que puede hacer por su salud. La [employer] de los adultos deben seguir estas pautas: Education officer, environmental, al menos, 150 minutos de actividad fsica por semana. El ejercicio debe aumentar la frecuencia cardaca y Media planner transpirar (ejercicio de intensidad moderada). Hacer ejercicios de fortalecimiento por lo [employer] por semana. Agregue esto a su plan de ejercicio de intensidad moderada. Pase menos tiempo sentada. Incluso la actividad fsica ligera puede ser beneficiosa. Controle sus niveles de colesterol y lpidos en la sangre Comience a realizarse anlisis de lpidos y Oncologist en la sangre a los 20 aos y  luego reptalos cada 5 aos. Hgase controlar los niveles de colesterol con mayor frecuencia si: Sus niveles de lpidos y colesterol son altos. Es mayor de 40 aos. Presenta un alto riesgo de padecer enfermedades cardacas. Qu debo saber sobre las pruebas de deteccin del cncer? Segn su historia clnica y sus antecedentes familiares, es posible que deba realizarse pruebas de deteccin del cncer en diferentes edades. Esto puede incluir pruebas de deteccin de lo siguiente: Cncer de mama. Cncer de cuello uterino. Cncer colorrectal. Cncer de piel. Cncer de pulmn. Qu debo saber sobre la enfermedad cardaca, la diabetes y la hipertensin arterial? Presin arterial y enfermedad cardaca La hipertensin arterial causa enfermedades cardacas y [place] el riesgo de accidente cerebrovascular. Es ms probable que esto se manifieste en las personas que tienen lecturas de presin arterial alta o tienen sobrepeso. Hgase controlar la presin arterial: Cada 3 a 5 aos si tiene entre 18 y 59 aos. Todos los aos si es mayor de 40 aos. Diabetes Realcese exmenes de deteccin de la diabetes con regularidad. Este anlisis revisa el nivel de azcar en la sangre en [place]. Hgase las pruebas de deteccin: Cada tres aos despus de los 40 aos de edad si tiene un peso normal y un bajo riesgo de padecer diabetes. Con ms frecuencia y a partir de [place] edad inferior si tiene sobrepeso o un alto riesgo de padecer diabetes. Qu debo saber sobre la prevencin de infecciones? Hepatitis B Si tiene un riesgo ms alto de contraer hepatitis B, debe someterse a un examen de deteccin de este virus. Hable con el mdico para averiguar si tiene riesgo de contraer la infeccin por hepatitis B. Hepatitis C Se recomienda el anlisis a: [employer] 1945 y 1965. Todas las personas que tengan un riesgo de haber contrado hepatitis C. Enfermedades de transmisin sexual (ETS) Hgase las pruebas de  Airline pilot de ITS, incluidas la gonorrea y la clamidia, si: Es sexualmente activa y es menor de [address]. Es mayor de [address], y Public affairs consultant informa que corre riesgo de tener este tipo de infecciones. La actividad sexual ha cambiado desde que le hicieron la ltima prueba de deteccin y tiene un riesgo mayor de Warehouse manager clamidia o Copy. Pregntele al mdico si usted tiene riesgo. Pregntele al mdico si usted tiene Acupuncturist  de contraer VIH. El mdico tambin puede recomendarle un medicamento recetado para ayudar a evitar la infeccin por el VIH. Si elige tomar medicamentos para prevenir el VIH, primero debe [employer] de deteccin del VIH. Luego debe hacerse anlisis cada 3 meses mientras est tomando los medicamentos. Embarazo Si est por dejar de Armed forces training and education officer (fase premenopusica) y usted puede quedar [place], busque asesoramiento antes de [place]. Tome de 400 a 800 microgramos (mcg) de cido [employer] si [place]. Pida mtodos de control de la natalidad (anticonceptivos) si desea evitar un embarazo no deseado. Osteoporosis y [place] La osteoporosis es una enfermedad en la que los huesos pierden los minerales y la fuerza por el avance de la edad. El resultado pueden ser fracturas en los [place]. Si tiene 65 aos o ms, o si est en riesgo de sufrir osteoporosis y fracturas, pregunte a su mdico si debe: Hacerse pruebas de deteccin de prdida sea. Tomar un suplemento de calcio o de vitamina D para reducir el riesgo de fracturas. Recibir terapia de reemplazo hormonal (TRH) para tratar los sntomas de la menopausia. Siga estas indicaciones en su casa: Consumo de alcohol No beba alcohol si: Su mdico le indica no hacerlo. Est embarazada, puede estar embarazada o est tratando de [place]. Si bebe alcohol: Limite la cantidad que bebe a lo siguiente: De 0 a 1 bebida por da. Sepa cunta cantidad de alcohol hay en las bebidas que toma. En  los [address], una medida equivale a una botella de cerveza de 12 oz (355 ml), un vaso de vino de 5 oz (148 ml) o un vaso de una bebida alcohlica de alta graduacin de 1 oz (44 ml). Estilo de vida No consuma ningn producto que contenga nicotina o tabaco. Estos productos incluyen cigarrillos, tabaco para Theatre manager y aparatos de vapeo, como los Administrator, Civil Service. Si necesita ayuda para dejar de consumir estos productos, consulte al mdico. No consuma drogas. No comparta agujas. Solicite ayuda a su mdico si necesita apoyo o informacin para abandonar las drogas. Indicaciones generales Realcese los estudios de rutina de [address], dentales y de Wellsite geologist. Mantngase al da con las vacunas. Infrmele a su mdico si: Se siente deprimida con frecuencia. Alguna vez ha sido vctima de [place] o no se siente seguro en su casa. Resumen Adoptar un estilo de vida saludable y recibir atencin preventiva son importantes para promover la salud y Counsellor. Siga las instrucciones del mdico acerca de una dieta saludable, el ejercicio y la realizacin de pruebas o exmenes para Hotel manager. Siga las instrucciones del mdico con respecto al control del colesterol y la presin arterial. Esta informacin no tiene Theme park manager el consejo del mdico. Asegrese de hacerle al mdico cualquier pregunta que tenga. Document Revised: 10/03/2020 Document Reviewed: 10/03/2020 Elsevier Patient Education  2023 Elsevier Inc.      Edwina Barth, MD [facility]

## 2024-03-03 ENCOUNTER — Other Ambulatory Visit: Payer: Self-pay

## 2024-03-03 ENCOUNTER — Other Ambulatory Visit: Payer: Self-pay | Admitting: Ophthalmology

## 2024-03-03 DIAGNOSIS — E039 Hypothyroidism, unspecified: Secondary | ICD-10-CM

## 2024-04-15 ENCOUNTER — Ambulatory Visit
Admission: RE | Admit: 2024-04-15 | Discharge: 2024-04-15 | Disposition: A | Payer: Self-pay | Source: Ambulatory Visit | Attending: Ophthalmology | Admitting: Ophthalmology

## 2024-04-15 DIAGNOSIS — E039 Hypothyroidism, unspecified: Secondary | ICD-10-CM
